# Patient Record
Sex: Female | Born: 1955 | Race: Black or African American | Hispanic: No | Marital: Single | Smoking: Former smoker
Health system: Southern US, Community
[De-identification: ages and names within clinical notes are randomized; demographics above are authoritative.]

## PROBLEM LIST (undated history)

## (undated) DIAGNOSIS — F99 Mental disorder, not otherwise specified: Secondary | ICD-10-CM

## (undated) DIAGNOSIS — J45909 Unspecified asthma, uncomplicated: Secondary | ICD-10-CM

## (undated) DIAGNOSIS — E079 Disorder of thyroid, unspecified: Secondary | ICD-10-CM

## (undated) DIAGNOSIS — E119 Type 2 diabetes mellitus without complications: Secondary | ICD-10-CM

## (undated) DIAGNOSIS — D649 Anemia, unspecified: Secondary | ICD-10-CM

## (undated) HISTORY — DX: Unspecified asthma, uncomplicated: J45.909

## (undated) HISTORY — DX: Mental disorder, not otherwise specified: F99

## (undated) HISTORY — PX: THYROIDECTOMY: SHX17

## (undated) HISTORY — PX: CHOLECYSTECTOMY: SHX55

---

## 2014-12-07 LAB — PULMONARY FUNCTION TEST

## 2015-02-28 ENCOUNTER — Emergency Department (HOSPITAL_COMMUNITY)
Admission: EM | Admit: 2015-02-28 | Discharge: 2015-02-28 | Disposition: A | Discharge status: Home / Self Care | Payer: Federal, State, Local not specified - PPO | Source: Home / Self Care

## 2015-02-28 ENCOUNTER — Emergency Department (INDEPENDENT_AMBULATORY_CARE_PROVIDER_SITE_OTHER): Payer: Federal, State, Local not specified - PPO

## 2015-02-28 ENCOUNTER — Encounter (HOSPITAL_COMMUNITY): Payer: Self-pay | Admitting: Emergency Medicine

## 2015-02-28 DIAGNOSIS — S82891A Other fracture of right lower leg, initial encounter for closed fracture: Secondary | ICD-10-CM

## 2015-02-28 HISTORY — DX: Type 2 diabetes mellitus without complications: E11.9

## 2015-02-28 HISTORY — DX: Disorder of thyroid, unspecified: E07.9

## 2015-02-28 HISTORY — DX: Anemia, unspecified: D64.9

## 2015-02-28 MED ORDER — HYDROCODONE-ACETAMINOPHEN 5-325 MG PO TABS: 1.0000 | ORAL_TABLET | ORAL | Status: DC | PRN

## 2015-02-28 MED ORDER — KETOROLAC TROMETHAMINE 60 MG/2ML IM SOLN
60.0000 mg | Freq: Once | INTRAMUSCULAR | Status: AC
  Administered 2015-02-28: 60 mg via INTRAMUSCULAR

## 2015-02-28 MED ORDER — KETOROLAC TROMETHAMINE 60 MG/2ML IM SOLN
INTRAMUSCULAR | Status: AC
  Filled 2015-02-28: qty 2

## 2015-02-28 NOTE — ED Provider Notes (Signed)
CSN: [id number]     Arrival date & time 02/28/15  1543 History   None    Chief Complaint  Patient presents with  . Ankle Injury   (Consider location/radiation/quality/duration/timing/severity/associated sxs/prior Treatment) Patient is a 59 y.o. female presenting with lower extremity injury. The history is provided by the patient.  Ankle Injury This is a new problem. The current episode started yesterday. The problem occurs constantly. The problem has been gradually worsening. The symptoms are aggravated by exertion. Nothing relieves the symptoms. She has tried acetaminophen for the symptoms.    Past Medical History  Diagnosis Date  . Diabetes mellitus without complication (HCC)   . Thyroid disease   . Anemia    Past Surgical History  Procedure Laterality Date  . Thyroidectomy    . Cholecystectomy    . Cesarean section     History reviewed. No pertinent family history. Social History  Substance Use Topics  . Smoking status: Never Smoker   . Smokeless tobacco: None  . Alcohol Use: Yes     Comment: social   OB History    No data available     Review of Systems  Constitutional: Negative.   HENT: Negative.   Eyes: Negative.   Respiratory: Negative.   Cardiovascular: Negative.   Gastrointestinal: Negative.   Endocrine: Negative.   Genitourinary: Negative.   Musculoskeletal: Positive for joint swelling and arthralgias.  Skin: Negative.   Allergic/Immunologic: Negative.   Neurological: Negative.   Hematological: Negative.   Psychiatric/Behavioral: Negative.     Allergies  Review of patient's allergies indicates no known allergies.  Home Medications   Prior to Admission medications   Medication Sig Start Date End Date Taking? Authorizing Provider  DULoxetine (CYMBALTA) 30 MG capsule Take 30 mg by mouth daily.   Yes Historical Provider, MD  levothyroxine (SYNTHROID, LEVOTHROID) 100 MCG tablet Take 100 mcg by mouth daily before breakfast.   Yes Historical Provider,  MD  omeprazole (PRILOSEC) 20 MG capsule Take 20 mg by mouth daily.   Yes Historical Provider, MD  ranitidine (ZANTAC) 150 MG capsule Take 150 mg by mouth 2 (two) times daily.   Yes Historical Provider, MD  HYDROcodone-acetaminophen (NORCO/VICODIN) 5-325 MG tablet Take 1 tablet by mouth every 4 (four) hours as needed. 02/28/15   Deatra CanterWilliam J Moraima Burd, FNP   Meds Ordered and Administered this Visit   Medications  ketorolac (TORADOL) injection 60 mg (60 mg Intramuscular Given 02/28/15 1753)    BP 130/71 mmHg  Pulse 72  Temp(Src) 97.9 F (36.6 C) (Oral)  Resp 16  SpO2 100% No data found.   Physical Exam  Constitutional: She appears well-developed and well-nourished.  HENT:  Head: Normocephalic and atraumatic.  Right Ear: External ear normal.  Left Ear: External ear normal.  Nose: Nose normal.  Mouth/Throat: Oropharynx is clear and moist.  Eyes: Conjunctivae and EOM are normal. Pupils are equal, round, and reactive to light.  Neck: Normal range of motion. Neck supple.  Cardiovascular: Normal rate, regular rhythm and normal heart sounds.   Pulmonary/Chest: Effort normal and breath sounds normal.  Abdominal: Soft. Bowel sounds are normal.  Musculoskeletal: She exhibits tenderness.  TTP dorsal area anterior ankle with mild edema. Decreased ROM and patient having difficulty with bearing weight and had to be wheeled into room with WC.    ED Course  Procedures (including critical care time)  Labs Review Labs Reviewed - No data to display  Imaging Review Dg Ankle Complete Right  02/28/2015  CLINICAL DATA:  Status  post fall down stairs today. Right foot pain. Difficulty bearing weight. Initial encounter. EXAM: RIGHT ANKLE - COMPLETE 3+ VIEW COMPARISON:  None. FINDINGS: Small avulsion fractures are seen off the dorsal neck of the talus and dorsal navicular bone. No other fracture is identified. Plantar calcaneal spur is noted. Mild midfoot degenerative change is seen. IMPRESSION: Small  avulsion fractures dorsal neck of the talus and navicular bones. Electronically Signed   By: Drusilla Kanner M.D.   On: 02/28/2015 17:37   Dg Foot Complete Right  02/28/2015  CLINICAL DATA:  Larey Seat down stairs today. Right foot and ankle pain. Unable to bear weight. Initial encounter. EXAM: RIGHT FOOT COMPLETE - 3+ VIEW COMPARISON:  None. FINDINGS: There are small avulsion fractures involving the dorsal navicular and dorsal anterior talus with overlying soft tissue swelling. There is no dislocation. Mild deformity of the distal phalanx of the great toe may be developmental or related to remote trauma. A small plantar calcaneal enthesophyte is noted. IMPRESSION: Small avulsion fractures of the dorsal talus and navicular. Electronically Signed   By: Sebastian Ache M.D.   On: 02/28/2015 17:41     Visual Acuity Review  Right Eye Distance:   Left Eye Distance:   Bilateral Distance:    Right Eye Near:   Left Eye Near:    Bilateral Near:         MDM   1. Avulsion fracture of right ankle, closed, initial encounter    Right Boot for imobilization.  Follow up with Orthopedics tomorrow. Norco 1 po q 4 hours for pain #10 Crutches.  Note for work.  Anselm Pancoast Mayreli Alden FNP    Deatra Canter, FNP 02/28/15 1816

## 2015-02-28 NOTE — ED Notes (Signed)
Pt fell down a flight of stairs this morning, twisting her right ankle and injuring the foot and ankle.  She is not exactly sure what she may have done to it.  She was hoping it would get better, but it has gotten progressively worse as the day has gone on.  She has some swelling on the lateral ankle and on the top of her foot.

## 2015-02-28 NOTE — Discharge Instructions (Signed)
°Cast or Splint Care  ° ° °Casts and splints support injured limbs and keep bones from moving while they heal. It is important to care for your cast or splint at home.  °HOME CARE INSTRUCTIONS  °Keep the cast or splint uncovered during the drying period. It can take 24 to 48 hours to dry if it is made of plaster. A fiberglass cast will dry in less than 1 hour.  °Do not rest the cast on anything harder than a pillow for the first 24 hours.  °Do not put weight on your injured limb or apply pressure to the cast until your health care provider gives you permission.  °Keep the cast or splint dry. Wet casts or splints can lose their shape and may not support the limb as well. A wet cast that has lost its shape can also create harmful pressure on your skin when it dries. Also, wet skin can become infected.  °Cover the cast or splint with a plastic bag when bathing or when out in the rain or snow. If the cast is on the trunk of the body, take sponge baths until the cast is removed.  °If your cast does become wet, dry it with a towel or a blow dryer on the cool setting only. °Keep your cast or splint clean. Soiled casts may be wiped with a moistened cloth.  °Do not place any hard or soft foreign objects under your cast or splint, such as cotton, toilet paper, lotion, or powder.  °Do not try to scratch the skin under the cast with any object. The object could get stuck inside the cast. Also, scratching could lead to an infection. If itching is a problem, use a blow dryer on a cool setting to relieve discomfort.  °Do not trim or cut your cast or remove padding from inside of it.  °Exercise all joints next to the injury that are not immobilized by the cast or splint. For example, if you have a long leg cast, exercise the hip joint and toes. If you have an arm cast or splint, exercise the shoulder, elbow, thumb, and fingers.  °Elevate your injured arm or leg on 1 or 2 pillows for the first 1 to 3 days to decrease swelling and  pain. It is best if you can comfortably elevate your cast so it is higher than your heart. °SEEK MEDICAL CARE IF:  °Your cast or splint cracks.  °Your cast or splint is too tight or too loose.  °You have unbearable itching inside the cast.  °Your cast becomes wet or develops a soft spot or area.  °You have a bad smell coming from inside your cast.  °You get an object stuck under your cast.  °Your skin around the cast becomes red or raw.  °You have new pain or worsening pain after the cast has been applied. °SEEK IMMEDIATE MEDICAL CARE IF:  °You have fluid leaking through the cast.  °You are unable to move your fingers or toes.  °You have discolored (blue or white), cool, painful, or very swollen fingers or toes beyond the cast.  °You have tingling or numbness around the injured area.  °You have severe pain or pressure under the cast.  °You have any difficulty with your breathing or have shortness of breath.  °You have chest pain. °This information is not intended to replace advice given to you by your health care provider. Make sure you discuss any questions you have with your health care provider.  °  Document Released: 04/06/2000 Document Revised: 01/28/2013 Document Reviewed: 10/16/2012  °Elsevier Interactive Patient Education ©2016 [employer]  ° °

## 2015-08-10 ENCOUNTER — Encounter (HOSPITAL_COMMUNITY): Payer: Self-pay | Admitting: Emergency Medicine

## 2015-08-10 ENCOUNTER — Ambulatory Visit (HOSPITAL_COMMUNITY)
Admission: EM | Admit: 2015-08-10 | Discharge: 2015-08-10 | Disposition: A | Discharge status: Home / Self Care | Payer: Federal, State, Local not specified - PPO | Attending: Emergency Medicine | Admitting: Emergency Medicine

## 2015-08-10 DIAGNOSIS — L03011 Cellulitis of right finger: Secondary | ICD-10-CM

## 2015-08-10 MED ORDER — IBUPROFEN 800 MG PO TABS
ORAL_TABLET | ORAL | Status: AC
  Filled 2015-08-10: qty 1

## 2015-08-10 MED ORDER — IBUPROFEN 800 MG PO TABS
800.0000 mg | ORAL_TABLET | Freq: Once | ORAL | Status: AC
  Administered 2015-08-10: 800 mg via ORAL

## 2015-08-10 MED ORDER — CEPHALEXIN 500 MG PO CAPS: 500.0000 mg | ORAL_CAPSULE | Freq: Four times a day (QID) | ORAL | Status: DC

## 2015-08-10 MED ORDER — LIDOCAINE HCL (PF) 2 % IJ SOLN
INTRAMUSCULAR | Status: AC
  Filled 2015-08-10: qty 2

## 2015-08-10 MED ORDER — HYDROCODONE-ACETAMINOPHEN 5-325 MG PO TABS: 1.0000 | ORAL_TABLET | Freq: Four times a day (QID) | ORAL | Status: DC | PRN

## 2015-08-10 NOTE — ED Provider Notes (Signed)
CSN: [id number]     Arrival date & time 08/10/15  1324 History   First MD Initiated Contact with Patient 08/10/15 1449     Chief Complaint  Patient presents with  . Finger Injury   (Consider location/radiation/quality/duration/timing/severity/associated sxs/prior Treatment) HPI  She is a 60 year old woman here for evaluation of right middle finger pain. She states she noticed some swelling around the cuticle about a week ago. In the last day or 2 she has noticed a pus pocket developed. She states it is quite painful. She did [facility], who recommended going to an urgent care to get it drained. She denies biting her nails, but states she does pick at the cuticles.  Past Medical History  Diagnosis Date  . Diabetes mellitus without complication (HCC)   . Thyroid disease   . Anemia    Past Surgical History  Procedure Laterality Date  . Thyroidectomy    . Cholecystectomy    . Cesarean section     No family history on file. Social History  Substance Use Topics  . Smoking status: Never Smoker   . Smokeless tobacco: None  . Alcohol Use: Yes     Comment: social   OB History    No data available     Review of Systems As in history of present illness Allergies  Review of patient's allergies indicates no known allergies.  Home Medications   Prior to Admission medications   Medication Sig Start Date End Date Taking? Authorizing Provider  cephALEXin (KEFLEX) 500 MG capsule Take 1 capsule (500 mg total) by mouth 4 (four) times daily. 08/10/15   Charm RingsErin J Natori Gudino, MD  DULoxetine (CYMBALTA) 30 MG capsule Take 30 mg by mouth daily.    Historical Provider, MD  HYDROcodone-acetaminophen (NORCO) 5-325 MG tablet Take 1 tablet by mouth every 6 (six) hours as needed for moderate pain. 08/10/15   Charm RingsErin J Natilee Gauer, MD  levothyroxine (SYNTHROID, LEVOTHROID) 100 MCG tablet Take 100 mcg by mouth daily before breakfast.    Historical Provider, MD  omeprazole (PRILOSEC) 20 MG capsule Take 20 mg by  mouth daily.    Historical Provider, MD  ranitidine (ZANTAC) 150 MG capsule Take 150 mg by mouth 2 (two) times daily.    Historical Provider, MD   Meds Ordered and Administered this Visit   Medications  ibuprofen (ADVIL,MOTRIN) tablet 800 mg (800 mg Oral Given 08/10/15 1543)    BP 144/77 mmHg  Pulse 89  Temp(Src) 98 F (36.7 C) (Oral)  Resp 20  SpO2 97% No data found.   Physical Exam  Constitutional: She is oriented to person, place, and time. She appears well-developed and well-nourished. No distress.  Cardiovascular: Normal rate.   Pulmonary/Chest: Effort normal.  Musculoskeletal:  Middle finger: There is swelling and erythema in the distal finger. There is fluctuance around the cuticle.  Neurological: She is alert and oriented to person, place, and time.    ED Course  .Marland Kitchen.Incision and Drainage Date/Time: 08/10/2015 3:53 PM Performed by: Charm RingsHONIG, Tyleek Smick J Authorized by: Charm RingsHONIG, Becca Bayne J Consent: Verbal consent obtained. Risks and benefits: risks, benefits and alternatives were discussed Consent given by: patient Patient understanding: patient states understanding of the procedure being performed Patient identity confirmed: verbally with patient Time out: Immediately prior to procedure a "time out" was called to verify the correct patient, procedure, equipment, support staff and site/side marked as required. Type: abscess Body area: upper extremity Location details: right long finger Anesthesia: local infiltration Local anesthetic: lidocaine  2% without epinephrine Anesthetic total: 0.5 ml Scalpel size: 11 Incision type: single straight Incision depth: dermal Complexity: simple Drainage: purulent and  serous Drainage amount: moderate Wound treatment: wound left open Patient tolerance: Patient tolerated the procedure well with no immediate complications   (including critical care time)  Labs Review Labs Reviewed - No data to display  Imaging Review No results  found.   MDM   1. Paronychia of finger of right hand    I&D performed today. Keflex for 1 week. Recommended warm soaks 3 times a day for the next several days. Prescription given for hydrocodone to use as needed for pain. Follow-up if not improving in 2 days.    Charm Rings, MD 08/10/15 [phone]

## 2015-08-10 NOTE — ED Notes (Signed)
PT has swelling and pain around right middle finger nail. Dr. Piedad ClimesHonig at bedside

## 2015-08-10 NOTE — Discharge Instructions (Signed)
You have an infection of the cuticle called paronychia. I drained the pus today. Please do warm soaks with Epsom salts or soap for 20 minutes at least 3 times a day. Take the Keflex as prescribed. Use the hydrocodone every 4-6 hours as needed for pain. You can also apply ice to help with the pain. If this is not improving in 2 days, please come back.

## 2017-06-10 ENCOUNTER — Encounter: Payer: Self-pay | Admitting: Internal Medicine

## 2017-06-10 ENCOUNTER — Ambulatory Visit (INDEPENDENT_AMBULATORY_CARE_PROVIDER_SITE_OTHER): Payer: Non-veteran care | Admitting: Internal Medicine

## 2017-06-10 ENCOUNTER — Other Ambulatory Visit (INDEPENDENT_AMBULATORY_CARE_PROVIDER_SITE_OTHER): Payer: Non-veteran care

## 2017-06-10 VITALS — BP 132/74 | HR 85 | Ht 63.0 in | Wt 232.0 lb

## 2017-06-10 DIAGNOSIS — J45991 Cough variant asthma: Secondary | ICD-10-CM | POA: Diagnosis not present

## 2017-06-10 DIAGNOSIS — R0609 Other forms of dyspnea: Secondary | ICD-10-CM | POA: Diagnosis not present

## 2017-06-10 LAB — CBC WITH DIFFERENTIAL/PLATELET
BASOS ABS: 0.1 10*3/uL (ref 0.0–0.1)
Basophils Relative: 1.1 % (ref 0.0–3.0)
EOS ABS: 0.2 10*3/uL (ref 0.0–0.7)
Eosinophils Relative: 3.9 % (ref 0.0–5.0)
HCT: 34.2 % — ABNORMAL LOW (ref 36.0–46.0)
Hemoglobin: 11.5 g/dL — ABNORMAL LOW (ref 12.0–15.0)
LYMPHS ABS: 1.7 10*3/uL (ref 0.7–4.0)
Lymphocytes Relative: 27.8 % (ref 12.0–46.0)
MCHC: 33.5 g/dL (ref 30.0–36.0)
MCV: 77.9 fl — ABNORMAL LOW (ref 78.0–100.0)
MONO ABS: 0.4 10*3/uL (ref 0.1–1.0)
Monocytes Relative: 7.2 % (ref 3.0–12.0)
NEUTROS PCT: 60 % (ref 43.0–77.0)
Neutro Abs: 3.7 10*3/uL (ref 1.4–7.7)
Platelets: 259 10*3/uL (ref 150.0–400.0)
RBC: 4.38 Mil/uL (ref 3.87–5.11)
RDW: 14.2 % (ref 11.5–15.5)
WBC: 6.1 10*3/uL (ref 4.0–10.5)

## 2017-06-10 MED ORDER — OMEPRAZOLE 40 MG PO CPDR: DELAYED_RELEASE_CAPSULE | ORAL | 2 refills | Status: DC

## 2017-06-10 NOTE — Progress Notes (Addendum)
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   06/10/2017 1st [facility] office visit/ Wert   Chief Complaint  Patient presents with  . Pulmonary Consult    [facility]. She states she was dxed with Asthma a few years ago.  She c/o SOB that comes and goes without any specific triggers. She uses proair 3 x per wk on average.   variable sob / sometimes at rest for one breath, then resolves, rarely lasts more than a few secs  s assoc cough or wheeze/ assoc overt hb Walking limited pain in knees  cpap at hs x one year thru [place]  No obvious day to day or daytime variability or assoc excess/ purulent sputum or mucus plugs or hemoptysis or cp or chest tightness, subjective wheeze. No unusual exposure hx or h/o childhood pna/ asthma or knowledge of premature birth.  Sleeping ok flat without nocturnal  or early am exacerbation  of respiratory  c/o's or need for noct saba. Also denies any obvious fluctuation of symptoms with weather or environmental changes or other aggravating or alleviating factors except as outlined above   Current Allergies, Complete Past Medical History, Past Surgical History, Family History, and Social History were reviewed in [employer] record.  ROS  The following are not active complaints unless bolded Hoarseness, sore throat, dysphagia, dental problems, itching, sneezing,  nasal congestion or discharge of excess mucus or purulent secretions,  ear ache,   fever, chills, sweats, unintended wt loss or wt gain, classically pleuritic or exertional cp,  orthopnea pnd or leg swelling, presyncope, palpitations, abdominal pain, anorexia, nausea, vomiting, diarrhea  or change in bowel habits or change in bladder habits, change in stools or change in urine, dysuria, hematuria,  rash, arthralgias, visual complaints, headache, numbness, weakness or ataxia or problems with walking or coordination,  change in mood/affect or memory.        Current Meds  Medication Sig  . albuterol (PROAIR HFA) 108 (90 Base) MCG/ACT inhaler Inhale 2 puffs into the lungs every 6 (six) hours as needed for wheezing or shortness of breath.  . DULoxetine (CYMBALTA) 30 MG capsule Take 30 mg by mouth daily.  Marland Kitchen levothyroxine (SYNTHROID, LEVOTHROID) 100 MCG tablet Take 100 mcg by mouth daily before breakfast.  . METFORMIN HCL PO Take 1 tablet by mouth 2 (two) times daily. Unsure of strength  . ranitidine (ZANTAC) 150 MG capsule Take 150 mg by mouth every evening.   . [DISCONTINUED] omeprazole (PRILOSEC) 20 MG capsule Take 20 mg by mouth 2 (two) times daily.          Review of Systems     Objective:   Physical Exam    amb obese bf nad with extremely harsh cough   Wt Readings from Last 3 Encounters:  06/10/17 232 lb (105.2 kg)     Vital signs reviewed - Note on  arrival 02 sats  97% on RA      HEENT: nl dentition and oropharynx. Nl external ear canals without cough reflex -  moderate bilateral non-specific turbinate edema     NECK :  without JVD/Nodes/TM/ nl carotid upstrokes bilaterally   LUNGS: no acc muscle use,  Nl contour chest which is clear to A and P bilaterally with  Cough early  on insp    CV:  RRR  no s3 or murmur or increase in P2, and no edema   ABD:  soft and nontender with nl inspiratory excursion in the supine position. No bruits or organomegaly appreciated, bowel sounds nl  MS:  Nl gait/ ext warm without deformities, calf tenderness,  cyanosis or clubbing No obvious joint restrictions   SKIN: warm and dry without lesions    NEURO:  alert, approp, nl sensorium with  no motor or cerebellar deficits apparent.      Labs ordered 06/10/2017    Allergy eval    Cxr: none on file    Assessment:

## 2017-06-10 NOTE — Patient Instructions (Addendum)
Change prilosec to 20 mg x 2  = 40 mg Take 30- 60 min before your first and last meals of the day and contine zantac 150 mg at bedtime  Instead of your present allergy pill try For drainage / throat tickle try take CHLORPHENIRAMINE  4 mg - take one every 4 hours as needed - available over the counter- may cause drowsiness so start with just a bedtime dose or two and see how you tolerate it before trying in daytime      GERD (REFLUX)  is an extremely common cause of respiratory symptoms just like yours , many times with no obvious heartburn at all.    It can be treated with medication, but also with lifestyle changes including elevation of the head of your bed (ideally with 6 inch  bed blocks),  Smoking cessation, avoidance of late meals, excessive alcohol, and avoid fatty foods, chocolate, peppermint, colas, red wine, and acidic juices such as orange juice.  NO MINT OR MENTHOL PRODUCTS SO NO COUGH DROPS   USE SUGARLESS CANDY INSTEAD (Jolley ranchers or Stover's or Life Savers) or even ice chips will also do - the key is to swallow to prevent all throat clearing. NO OIL BASED VITAMINS - use powdered substitutes.  Please remember to go to the lab department downstairs in the basement  for your tests - we will call you with the results when they are available.      Please schedule a follow up office visit in 6 weeks, call sooner if needed  - needs cxr on return

## 2017-06-11 ENCOUNTER — Encounter: Payer: Self-pay | Admitting: Internal Medicine

## 2017-06-11 DIAGNOSIS — R0609 Other forms of dyspnea: Secondary | ICD-10-CM

## 2017-06-11 LAB — RESPIRATORY ALLERGY PROFILE REGION II ~~LOC~~
Allergen, A. alternata, m6: <0.1 kU/L
Allergen, Cedar tree, t12: <0.1 kU/L
Allergen, Comm Silver Birch, t9: <0.1 kU/L
Allergen, Cottonwood, t14: <0.1 kU/L
Allergen, D pternoyssinus,d7: <0.1 kU/L
Allergen, Mouse Urine Protein, e78: <0.1 kU/L
Allergen, Mulberry, t76: <0.1 kU/L
Allergen, Oak,t7: <0.1 kU/L
Allergen, P. notatum, m1: <0.1 kU/L
Aspergillus fumigatus, m3: <0.1 kU/L
BOX ELDER: 0.25 kU/L — AB
CLASS: 0
CLASS: 0
CLASS: 0
CLASS: 0
CLASS: 0
CLASS: 0
CLASS: 0
CLASS: 0
CLASS: 0
CLASS: 0
CLASS: 0
Cat Dander: <0.1 kU/L
Class: 0
Class: 0
Class: 0
Class: 0
Class: 0
Class: 0
Class: 0
Class: 0
Class: 0
Class: 0
Class: 0
Class: 0
Class: 0
D. farinae: <0.1 kU/L
Dog Dander: <0.1 kU/L
Elm IgE: <0.1 kU/L
IgE (Immunoglobulin E), Serum: 26 kU/L (ref <=114)
Johnson Grass: <0.1 kU/L
Rough Pigweed  IgE: <0.1 kU/L
Timothy Grass: <0.1 kU/L

## 2017-06-11 LAB — INTERPRETATION:

## 2017-06-11 NOTE — Assessment & Plan Note (Addendum)
06/10/2017  Walked RA x 3 laps @ 185 ft each stopped due to  End of study, slow pace, no desats  - spirometry wnl 06/10/2017   Sob at rest not reproduced with ex =  Symptoms  disproportionate to objective findings and not clear this is actually much of a  lung problem but pt does appear to have difficult to sort out respiratory symptoms of unknown origin for which  DDX  = almost all start with A and  include Adherence, Ace Inhibitors, Acid Reflux, Active Sinus Disease, Alpha 1 Antitripsin deficiency, Anxiety masquerading as Airways dz,  ABPA,  Allergy(esp in young), Aspiration (esp in elderly), Adverse effects of meds,  Active smokers, A bunch of PE's (a small clot burden can't cause this syndrome unless there is already severe underlying pulm or vascular dz with poor reserve). Anemia or thyroid dz plus two Bs  = Bronchiectasis and Beta blocker use..and one C= CHF    Adherence is always the initial "prime suspect" and is a multilayered concern that requires a "trust but verify" approach in every patient - starting with knowing how to use medications, especially inhalers, correctly, keeping up with refills and understanding the fundamental difference between maintenance and prns vs those medications only taken for a very short course and then stopped and not refilled.  - rec return with all meds in hand using a trust but verify approach to confirm accurate Medication  Reconciliation The principal here is that until we are certain that the  patients are doing what we've asked, it makes no sense to ask them to do more.    ? Acid (or non-acid) GERD > always difficult to exclude as up to 75% of pts in some series report no assoc GI/ Heartburn symptoms> rec max (24h)  acid suppression and diet restrictions/ reviewed and instructions given in writing.   ? Anxiety/obesity/ deconditioning  > usually at the bottom of this list of usual suspects but should be much higher on this pt's based on H and P and note already  on psychotropics .   ? Anemia / thyroid dz > note she is boderline anemia/ microcytic and on thryoid med  - [facility]  ? Allergies/ asthma > send profile, just use hfa for now prn as doubt this is asthma (see separate a/p)    see avs for instructions unique to this ov

## 2017-06-11 NOTE — Progress Notes (Signed)
Spoke with pt and notified of results per Dr. Wert. Pt verbalized understanding and denied any questions. 

## 2017-06-11 NOTE — Assessment & Plan Note (Signed)
Body mass index is 41.1 kg/m.  -   No results found for: TSH   Contributing to gerd risk/ doe/reviewed the need and the process to achieve and maintain neg calorie balance > defer f/u primary care including intermittently monitoring thyroid status

## 2017-06-11 NOTE — Assessment & Plan Note (Addendum)
Spirometry  8//16/16  FEV1 2.43 (118%0 and ratio 85 with nl insp/exp f/v loop  Spirometry 06/10/2017  FEV1 2.41 (119%)  Ratio 86   Allergy profile 06/10/2017 >  Eos 0.2 /  IgE pending  - max rx for gerd and 1st gen H1 blockers per guidelines  06/10/2017 >>>  Doubt asthma here and instead favor: Upper airway cough syndrome (previously labeled PNDS),  is so named because it's frequently impossible to sort out how much is  CR/sinusitis with freq throat clearing (which can be related to primary GERD)   vs  causing  secondary (" extra esophageal")  GERD from wide swings in gastric pressure that occur with throat clearing, often  promoting self use of mint and menthol lozenges that reduce the lower esophageal sphincter tone and exacerbate the problem further in a cyclical fashion.   These are the same pts (now being labeled as having "irritable larynx syndrome" by some cough centers) who not infrequently have a history of having failed to tolerate ace inhibitors,  dry powder inhalers or biphosphonates or report having atypical/extraesophageal reflux symptoms that don't respond to standard doses of PPI  and are easily confused as having aecopd or asthma flares by even experienced allergists/ pulmonologists (myself included).   rec trial of rx for gerd and 1st gen H1 per guidelines then return to regroup in 6 weeks    Total time devoted to counseling  > 50 % of initial 60 min office visit:  review case with pt/ discussion of options/alternatives/ personally creating written customized instructions  in presence of pt  then going over those specific  Instructions directly with the pt including how to use all of the meds but in particular covering each new medication in detail and the difference between the maintenance= "automatic" meds and the prns using an action plan format for the latter (If this problem/symptom => do that organization reading Left to right).  Please see AVS from this visit for a full list of  these instructions which I personally wrote for this pt and  are unique to this visit.

## 2017-07-11 ENCOUNTER — Telehealth: Payer: Self-pay | Admitting: Internal Medicine

## 2017-07-11 NOTE — Telephone Encounter (Signed)
Ov note faxed to the number that was provided

## 2017-07-22 ENCOUNTER — Encounter: Payer: Self-pay | Admitting: Internal Medicine

## 2017-07-22 ENCOUNTER — Ambulatory Visit (INDEPENDENT_AMBULATORY_CARE_PROVIDER_SITE_OTHER): Payer: Non-veteran care | Admitting: Internal Medicine

## 2017-07-22 VITALS — BP 134/78 | HR 97 | Ht 63.0 in | Wt 232.6 lb

## 2017-07-22 DIAGNOSIS — R0609 Other forms of dyspnea: Secondary | ICD-10-CM | POA: Diagnosis not present

## 2017-07-22 DIAGNOSIS — J45991 Cough variant asthma: Secondary | ICD-10-CM

## 2017-07-22 NOTE — Patient Instructions (Addendum)
Continue omeprazole 40 mg Take 30- 60 min before your first and last meals of the day   Take zantac = ranitidine at 150 mg at bedtime    If still having bad heartburn then use gaviscon liquid if really can't avoid the foods / drinks that cause it   For drainage / throat tickle try take CHLORPHENIRAMINE  4 mg - take one every 4 hours as needed - available over the counter- may cause drowsiness so start with just a bedtime dose or two and see how you tolerate it before trying in daytime     GERD (REFLUX)  is an extremely common cause of respiratory symptoms just like yours , many times with no obvious heartburn at all.    It can be treated with medication, but also with lifestyle changes including elevation of the head of your bed (ideally with 6 inch  bed blocks),  Smoking cessation, avoidance of late meals, excessive alcohol, and avoid fatty foods, chocolate, peppermint, colas, red wine, and acidic juices such as orange juice.  NO MINT OR MENTHOL PRODUCTS SO NO COUGH DROPS   USE SUGARLESS CANDY INSTEAD (Jolley ranchers or Stover's or Life Savers) or even ice chips will also do - the key is to swallow to prevent all throat clearing. NO OIL BASED VITAMINS - use powdered substitutes.   Please see patient coordinator before you leave today  to schedule methacholine challenge testing  Bring chest x-ray report from [facility]    Please schedule a follow up office visit in 4 weeks, sooner if needed

## 2017-07-22 NOTE — Progress Notes (Signed)
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     History of Present Illness  06/10/2017 1st [facility] office visit/ Samanatha Brammer   Chief Complaint  Patient presents with  . Pulmonary Consult    [facility]. She states she was dxed with Asthma a few years ago.  She c/o SOB that comes and goes without any specific triggers. She uses proair 3 x per wk on average.   variable sob / sometimes at rest for one breath, then resolves, rarely lasts more than a few secs  s assoc cough or wheeze/ assoc overt hb Walking limited pain in knees  [facility]> sleeps fine s aware of problem Change prilosec to 20 mg x 2  = 40 mg Take 30- 60 min before your first and last meals of the day and contine zantac 150 mg at bedtime  Instead of your present allergy pill try For drainage / throat tickle try take CHLORPHENIRAMINE  4 mg - take one every 4 hours as needed - available over the counter- may cause drowsiness so start with just a bedtime dose or two and see how you tolerate it before trying in daytime      GERD diet       07/22/2017  f/u ov/[place] re: uacs using mint lifesavers Chief Complaint  Patient presents with  . Follow-up    Breathing is unchanged. She states she has not used her rescue inhaler since the last visit.   Dyspnea:  Bike once a week x 5 min then stops s symptoms  Cough: better but still in am one a tsp or two white mucus   And  persistent sensation of pnds daytime but no mucus produced other than first thing ing am but doesn't disturb sleep Sleep: on cpap  SABA use:  None since last ov Only using h1 hs none daytime   No obvious day to day or daytime variability or assoc excess/ purulent sputum or mucus plugs or hemoptysis or cp or chest tightness, subjective wheeze or overt sinus or hb symptoms. No unusual exposure hx or h/o childhood pna/ asthma or knowledge of premature birth.  Sleeping ok on cpap without nocturnal  or early am exacerbation  of respiratory  c/o's or need for noct saba. Also denies any obvious fluctuation of symptoms with weather or environmental changes or other aggravating or alleviating factors except as outlined above   Current Allergies, Complete Past Medical History, Past Surgical History, Family History, and Social History were reviewed in [employer] record.  ROS  The following are not active complaints unless bolded Hoarseness, sore throat, dysphagia, dental problems, itching, sneezing,  nasal congestion or discharge of excess mucus or purulent secretions, ear ache,   fever, chills, sweats, unintended wt loss or wt gain, classically pleuritic or exertional cp,  orthopnea pnd or leg swelling, presyncope, palpitations, abdominal pain, anorexia, nausea, vomiting, diarrhea  or change in bowel habits or change in bladder habits, change in stools or change in urine, dysuria, hematuria,  rash, arthralgias, visual complaints, headache, numbness, weakness or ataxia or problems with walking or coordination,  change in mood/affect or memory.        Current Meds  Medication Sig  . albuterol (PROAIR HFA) 108 (90 Base) MCG/ACT inhaler Inhale 2 puffs into the lungs every 6 (six) hours as needed for wheezing or shortness of breath.  . ALPRAZolam (XANAX) 1 MG tablet Take 1 mg by mouth 2 (two) times daily as needed for anxiety.  Marland Kitchen. buPROPion (WELLBUTRIN XL) 300 MG 24 hr tablet Take 300 mg  by mouth daily.  . chlorpheniramine (CHLOR-TRIMETON) 4 MG tablet Take 4 mg by mouth 2 (two) times daily as needed for allergies.  . cyclobenzaprine (FLEXERIL) 10 MG tablet Take 5 mg by mouth 2 (two) times daily.  Marland Kitchen. gabapentin (NEURONTIN) 300 MG capsule Take 300 mg by mouth 3 (three) times daily.  Marland Kitchen. levothyroxine (SYNTHROID, LEVOTHROID) 125 MCG tablet Take 125 mcg by mouth daily before breakfast.  . methocarbamol (ROBAXIN) 750 MG tablet Take 750 mg by mouth 2 (two) times daily.  Marland Kitchen. omeprazole (PRILOSEC) 40 MG capsule Take 30- 60 min before your first and last meals of the day  . prazosin (MINIPRESS) 2 MG capsule Take 2 mg by mouth at bedtime.  . ranitidine (ZANTAC) 150 MG capsule Take 150 mg by mouth every evening.   . SUMAtriptan (IMITREX) 100 MG tablet Take 100 mg by mouth every 2 (two) hours as needed for migraine. May repeat in 2 hours if headache persists or recurs.  . Vilazodone HCl (VIIBRYD) 40 MG TABS Take 40 mg by mouth daily.          Objective:   Physical Exam  amb bf nad  Wt Readings from Last 3 Encounters:  07/22/17 232 lb 9.6 oz (105.5 kg)  06/10/17 232 lb (105.2 kg)     Vital signs reviewed - Note on arrival 02 sats  99% on RA        HEENT: nl dentition,   and oropharynx. Nl external ear canals without cough reflex - mild bilateral non-specific turbinate edema     NECK :  without JVD/Nodes/TM/ nl carotid upstrokes bilaterally   LUNGS: no acc muscle use,  Nl contour chest which is clear to A and P bilaterally without cough on insp or exp maneuvers   CV:  RRR  no s3 or murmur or increase in P2, and no edema   ABD:  Obese/ soft and nontender with nl inspiratory excursion in the supine position. No bruits or organomegaly appreciated, bowel sounds nl  MS:  Nl gait/ ext warm without deformities, calf tenderness, cyanosis or clubbing No obvious joint restrictions   SKIN: warm and dry without lesions    NEURO:  alert, approp, nl sensorium with  no motor or  cerebellar deficits apparent.           Assessment:

## 2017-07-23 ENCOUNTER — Encounter: Payer: Self-pay | Admitting: Internal Medicine

## 2017-07-23 NOTE — Assessment & Plan Note (Signed)
Spirometry  8//16/16  FEV1 2.43 (118%0 and ratio 85 with nl insp/exp f/v loop  Spirometry 06/10/2017  FEV1 2.41 (119%)  Ratio 86   Allergy profile 06/10/2017 >  Eos 0.2 /  IgE  26 RAST pos box elders only  - max rx for gerd and 1st gen H1 blockers per guidelines  06/10/2017 > improved 07/22/2017 but still sense of pnds not using h1 but at hs/ still using lots of mints - MCT scheduled   Improved but not resolved albeit not adherent with recs for h1 q 4h prn or diet restrictions on mints  Needs Methacholine challenge to complete the w/u   I had an extended discussion with the patient reviewing all relevant studies completed to date and  lasting 15 to 20 minutes of a 25 minute visit    Each maintenance medication was reviewed in detail including most importantly the difference between maintenance and prns and under what circumstances the prns are to be triggered using an action plan format that is not reflected in the computer generated alphabetically organized AVS.    Please see AVS for specific instructions unique to this visit that I personally wrote and verbalized to the the pt in detail and then reviewed with pt  by my nurse highlighting any  changes in therapy recommended at today's visit to their plan of care.

## 2017-07-23 NOTE — Assessment & Plan Note (Signed)
06/10/2017  Walked RA x 3 laps @ 185 ft each stopped due to  End of study, slow pace, no desats  - spirometry wnl 06/10/2017   Improving but not following recs for exercise > needs to build up to 30 min daily if possible and if not clear after several weeks of doing this and following her other instructions then cpst next step   Discussed in detail all the  indications, usual  risks and alternatives  relative to the benefits with patient who agrees to proceed with w/u as outlined.

## 2017-07-23 NOTE — Assessment & Plan Note (Signed)
Body mass index is 41.2 kg/m.  -  trending no change  No results found for: TSH   Contributing to gerd risk/ doe/reviewed the need and the process to achieve and maintain neg calorie balance > defer f/u primary care including intermittently monitoring thyroid status

## 2017-08-05 ENCOUNTER — Ambulatory Visit (HOSPITAL_COMMUNITY)
Admission: RE | Admit: 2017-08-05 | Discharge: 2017-08-05 | Disposition: A | Discharge status: Home / Self Care | Payer: Non-veteran care | Source: Ambulatory Visit | Attending: Internal Medicine | Admitting: Internal Medicine

## 2017-08-05 DIAGNOSIS — R0609 Other forms of dyspnea: Secondary | ICD-10-CM | POA: Diagnosis present

## 2017-08-05 LAB — PULMONARY FUNCTION TEST
FEF 25-75 POST: 2.88 L/s
FEF 25-75 PRE: 3.42 L/s
FEF2575-%Change-Post: -15 %
FEF2575-%PRED-POST: 143 %
FEF2575-%Pred-Pre: 171 %
FEV1-%Change-Post: -4 %
FEV1-%PRED-POST: 120 %
FEV1-%PRED-PRE: 125 %
FEV1-POST: 2.41 L
FEV1-Pre: 2.51 L
FEV1FVC-%Change-Post: 0 %
FEV1FVC-%PRED-PRE: 109 %
FEV6-%Change-Post: -3 %
FEV6-%PRED-POST: 114 %
FEV6-%PRED-PRE: 118 %
FEV6-POST: 2.82 L
FEV6-Pre: 2.91 L
FEV6FVC-%PRED-POST: 104 %
FEV6FVC-%Pred-Pre: 104 %
FVC-%CHANGE-POST: -3 %
FVC-%PRED-POST: 110 %
FVC-%PRED-PRE: 114 %
FVC-Post: 2.82 L
FVC-Pre: 2.91 L
POST FEV6/FVC RATIO: 100 %
PRE FEV1/FVC RATIO: 86 %
Post FEV1/FVC ratio: 86 %
Pre FEV6/FVC Ratio: 100 %

## 2017-08-05 MED ORDER — METHACHOLINE 16 MG/ML NEB SOLN
2.0000 mL | Freq: Once | RESPIRATORY_TRACT | Status: AC
  Administered 2017-08-05: 32 mg via RESPIRATORY_TRACT

## 2017-08-05 MED ORDER — SODIUM CHLORIDE 0.9 % IN NEBU
3.0000 mL | INHALATION_SOLUTION | Freq: Once | RESPIRATORY_TRACT | Status: AC
  Administered 2017-08-05: 3 mL via RESPIRATORY_TRACT

## 2017-08-05 MED ORDER — METHACHOLINE 0.25 MG/ML NEB SOLN
2.0000 mL | Freq: Once | RESPIRATORY_TRACT | Status: AC
  Administered 2017-08-05: 0.5 mg via RESPIRATORY_TRACT

## 2017-08-05 MED ORDER — ALBUTEROL SULFATE (2.5 MG/3ML) 0.083% IN NEBU
2.5000 mg | INHALATION_SOLUTION | Freq: Once | RESPIRATORY_TRACT | Status: AC
  Administered 2017-08-05: 2.5 mg via RESPIRATORY_TRACT

## 2017-08-05 MED ORDER — METHACHOLINE 1 MG/ML NEB SOLN
2.0000 mL | Freq: Once | RESPIRATORY_TRACT | Status: AC
  Administered 2017-08-05: 2 mg via RESPIRATORY_TRACT

## 2017-08-05 MED ORDER — METHACHOLINE 0.0625 MG/ML NEB SOLN
2.0000 mL | Freq: Once | RESPIRATORY_TRACT | Status: AC
  Administered 2017-08-05: 0.125 mg via RESPIRATORY_TRACT

## 2017-08-05 MED ORDER — METHACHOLINE 4 MG/ML NEB SOLN
2.0000 mL | Freq: Once | RESPIRATORY_TRACT | Status: AC
  Administered 2017-08-05: 8 mg via RESPIRATORY_TRACT

## 2017-08-06 NOTE — Progress Notes (Signed)
LMTCB

## 2017-08-07 NOTE — Progress Notes (Signed)
LMTCB

## 2017-08-14 ENCOUNTER — Encounter: Payer: Self-pay | Admitting: *Deleted

## 2017-08-14 NOTE — Progress Notes (Signed)
Mailing letter to the pt asking her to call for the results

## 2017-08-19 ENCOUNTER — Ambulatory Visit: Payer: No Typology Code available for payment source | Admitting: Internal Medicine

## 2017-08-22 ENCOUNTER — Ambulatory Visit (INDEPENDENT_AMBULATORY_CARE_PROVIDER_SITE_OTHER): Payer: Non-veteran care | Admitting: Internal Medicine

## 2017-08-22 ENCOUNTER — Encounter: Payer: Self-pay | Admitting: *Deleted

## 2017-08-22 ENCOUNTER — Encounter: Payer: Self-pay | Admitting: Internal Medicine

## 2017-08-22 VITALS — BP 120/82 | HR 64 | Ht 63.75 in | Wt 227.4 lb

## 2017-08-22 DIAGNOSIS — J45991 Cough variant asthma: Secondary | ICD-10-CM

## 2017-08-22 DIAGNOSIS — R05 Cough: Secondary | ICD-10-CM

## 2017-08-22 DIAGNOSIS — R059 Cough, unspecified: Secondary | ICD-10-CM

## 2017-08-22 MED ORDER — ACETAMINOPHEN-CODEINE #3 300-30 MG PO TABS: 1.0000 | ORAL_TABLET | ORAL | 0 refills | Status: AC | PRN

## 2017-08-22 MED ORDER — PREDNISONE 10 MG PO TABS: ORAL_TABLET | ORAL | 0 refills | Status: DC

## 2017-08-22 NOTE — Progress Notes (Signed)
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     History of Present Illness  06/10/2017 1st [facility] office visit/ Natalie Burgess   Chief Complaint  Patient presents with  . Pulmonary Consult    [facility]. She states she was dxed with Asthma a few years ago.  She c/o SOB that comes and goes without any specific triggers. She uses proair 3 x per wk on average.   variable sob / sometimes at rest for one breath, then resolves, rarely lasts more than a few secs  s assoc cough or wheeze/ assoc overt hb Walking limited pain in knees  [facility]> sleeps fine s aware of problem Change prilosec to 20 mg x 2  = 40 mg Take 30- 60 min before your first and last meals of the day and contine zantac 150 mg at bedtime  Instead of your present allergy pill try For drainage / throat tickle try take CHLORPHENIRAMINE  4 mg - take one every 4 hours as needed - available over the counter- may cause drowsiness so start with just a bedtime dose or two and see how you tolerate it before trying in daytime      GERD diet     07/22/2017  f/u ov/Natalie Burgess re: uacs using mint lifesavers Chief Complaint  Patient presents with  . Follow-up    Breathing is unchanged. She states she has not used her rescue inhaler since the last visit.   Dyspnea:  Bike once a week x 5 min then stops s symptoms  Cough: better but still in am one a tsp or two white mucus   And persistent  sensation of pnds daytime but no mucus produced other than first thing ing am but doesn't disturb sleep Sleep: on cpap  SABA use:  None since last ov Only using h1 hs none daytime rec Continue omeprazole 40 mg Take 30- 60 min before your first and last meals of the day  Take zantac = ranitidine at 150 mg at bedtime  If still having bad heartburn then use gaviscon liquid if really can't avoid the foods / drinks that cause it  For drainage / throat tickle try take CHLORPHENIRAMINE  4 mg - take one every 4 hours as needed - available over the counter-   GERD  Diet  schedule methacholine challenge testing > neg  Bring chest x-ray report from [place]    08/22/2017  f/u ov/Natalie Burgess re:  uacs Chief Complaint  Patient presents with  . Follow-up    Discuss MCT results. She states increased cough for the past wk- went to UC while at [facility] and was given cough syrup, tessalon and amox. She is still on abx and the cough has only improved slightly. She is producing some white sputum.  She has tried her albuterol  inhaler but it has not helped so she stopped using it.   Dyspnea:  20 min on bike s stopping  Cough: was the same/ not provoked by mct / did not take the chlor more than once a day Sleep: fine on cpap SABA use: not helping/ not using   Acutely ill one week prior to OV  Cough much worse / hoarse > strep neg/ rx amox / codeine > tessalon  Produces sputum all day long mucoid but no longer excessive or purulent   Gen chest anterior aching p coughing fits   No obvious patterns in  day to day or daytime variability or assoc excess/ purulent sputum or mucus plugs or hemoptysis   or chest tightness, subjective wheeze or overt sinus or hb symptoms. No unusual exposure hx or h/o childhood pna/ asthma or knowledge of premature birth.  Sleeping  On cpap fine  without nocturnal  or early am exacerbation  of respiratory  c/o's or need for noct saba. Also denies any obvious fluctuation of symptoms with  weather or environmental changes or other aggravating or alleviating factors except as outlined above   Current Allergies, Complete Past Medical History, Past Surgical History, Family History, and Social History were reviewed in [employer] record.  ROS  The following are not active complaints unless bolded Hoarseness, sore throat, dysphagia, dental problems, itching, sneezing,  nasal congestion or discharge of excess mucus or purulent secretions, ear ache,   fever, chills, sweats, unintended wt loss or wt gain, classically pleuritic or exertional cp,  orthopnea pnd or arm/hand swelling  or leg swelling, presyncope, palpitations, abdominal pain, anorexia, nausea, vomiting, diarrhea  or change in bowel habits or change in bladder habits, change in stools or change in urine, dysuria, hematuria,  rash, arthralgias, visual complaints, headache, numbness, weakness or ataxia or problems with walking or coordination,  change in mood or  memory.        Current Meds  Medication Sig  . ALPRAZolam (XANAX) 1 MG tablet Take 1 mg by mouth 2 (two) times daily as needed for anxiety.  Marland Kitchen amoxicillin (AMOXIL) 500 MG capsule Take 1 capsule by mouth 3 (three) times daily.  Marland Kitchen buPROPion (WELLBUTRIN XL) 300 MG 24 hr tablet Take 300 mg by mouth daily.  . chlorpheniramine (CHLOR-TRIMETON) 4 MG tablet Take 4 mg by mouth 2 (two) times daily as needed for allergies.  . cyclobenzaprine (FLEXERIL) 10 MG tablet Take 5 mg by mouth 2 (two) times daily.  Marland Kitchen gabapentin (NEURONTIN) 300 MG capsule Take 300 mg by mouth 3 (three) times daily.  Marland Kitchen levothyroxine (SYNTHROID, LEVOTHROID) 125 MCG tablet Take 125 mcg by mouth daily before breakfast.  . methocarbamol (ROBAXIN) 750 MG tablet Take 750 mg by mouth 2 (two) times daily.  Marland Kitchen omeprazole (PRILOSEC) 40 MG capsule Take 30- 60 min before your first and last meals of the day  . prazosin (MINIPRESS) 2 MG capsule Take 2 mg by mouth at bedtime.  . ranitidine (ZANTAC) 150  MG capsule Take 150 mg by mouth every evening.   . SUMAtriptan (IMITREX) 100 MG tablet Take 100 mg by mouth every 2 (two) hours as needed for migraine. May repeat in 2 hours if headache persists or recurs.  . Vilazodone HCl (VIIBRYD) 40 MG TABS Take 40 mg by mouth daily.  . [DISCONTINUED] albuterol (PROAIR HFA) 108 (90 Base) MCG/ACT inhaler Inhale 2 puffs into the lungs every 6 (six) hours as needed for wheezing or shortness of breath.  . [  DISCONTINUED] benzonatate (TESSALON) 100 MG capsule Take 1 capsule by mouth 3 (three) times daily.  . [DISCONTINUED] guaiFENesin-codeine 100-10 MG/5ML syrup Take 5 mLs by mouth 2 (two) times daily as needed.                       Objective:   Physical Exam  amb bf harsh upper airway coughing fits   08/22/2017         227  07/22/17 232 lb 9.6 oz (105.5 kg)  06/10/17 232 lb (105.2 kg)    Vital signs reviewed - Note on arrival 02 sats  97% on RA   HEENT: nl dentition, turbinates bilaterally, and oropharynx. Nl external ear canals without cough reflex   NECK :  without JVD/Nodes/TM/ nl carotid upstrokes bilaterally   LUNGS: no acc muscle use,  Nl contour chest which is clear to A and P bilaterally without cough on insp or exp maneuvers   CV:  RRR  no s3 or murmur or increase in P2, and no edema   ABD:  soft and nontender with nl inspiratory excursion in the supine position. No bruits or organomegaly appreciated, bowel sounds nl  MS:  Nl gait/ ext warm without deformities, calf tenderness, cyanosis or clubbing No obvious joint restrictions   SKIN: warm and dry without lesions    NEURO:  alert, approp, nl sensorium with  no motor or cerebellar deficits apparent.      Labs ordered 08/22/2017  Allergy profile          Assessment:

## 2017-08-22 NOTE — Patient Instructions (Addendum)
Take mucinex dm 1200 mg  every 12 hours and supplement if needed with  Tylenol 3#  up to 1- 2 every 4 hours to suppress the urge to cough. Swallowing water and/or using ice chips/non mint and menthol containing candies (such as lifesavers or sugarless jolly ranchers) are also effective.  You should rest your voice and avoid activities that you know make you cough.  Once you have eliminated the cough for 3 straight days try reducing the tylenol #3  first,  then the delsym as tolerated.     Prednisone 10 mg take  4 each am x 2 days,   2 each am x 2 days,  1 each am x 2 days and stop    For drainage / throat tickle try take CHLORPHENIRAMINE  4 mg - take one-two every 4 hours as needed - available over the counter- may cause drowsiness so start with just a bedtime dose or two and see how you tolerate it before trying in daytime     Please see patient coordinator before you leave today  to schedule sinus CT    Please schedule a follow up office visit in 4 weeks, sooner if needed  with all medications /inhalers/ solutions in hand so we can verify exactly what you are taking. This includes all medications from all doctors and over the counters - separate into two bag:  Automatic vs as needed

## 2017-08-25 ENCOUNTER — Encounter: Payer: Self-pay | Admitting: Internal Medicine

## 2017-08-25 NOTE — Assessment & Plan Note (Addendum)
Spirometry  8//16/16  FEV1 2.43 (118%0 and ratio 85 with nl insp/exp f/v loop  Spirometry 06/10/2017  FEV1 2.41 (119%)  Ratio 86   Allergy profile 06/10/2017 >  Eos 0.2 /  IgE  26 RAST pos box elders only  - max rx for gerd and 1st gen H1 blockers per guidelines  06/10/2017 > improved 07/22/2017 but still sense of pnds not using h1 but at hs/ still using lots of mints   - MCT 08/05/17 > neg   - Allergy profile 08/22/2017 >  Eos 0.2 /  IgE  26 RAST pos only for box elders - Sinus CT ordered   Since asthma excluded next step is be sure she doesn't have sinus dz but mostly likely this is Upper airway cough syndrome (previously labeled PNDS),  is so named because it's frequently impossible to sort out how much is  CR/sinusitis with freq throat clearing (which can be related to primary GERD)   vs  causing  secondary (" extra esophageal")  GERD from wide swings in gastric pressure that occur with throat clearing, often  promoting self use of mint and menthol lozenges that reduce the lower esophageal sphincter tone and exacerbate the problem further in a cyclical fashion.   These are the same pts (now being labeled as having "irritable larynx syndrome" by some cough centers) who not infrequently have a history of having failed to tolerate ace inhibitors,  dry powder inhalers or biphosphonates or report having atypical/extraesophageal reflux symptoms that don't respond to standard doses of PPI  and are easily confused as having aecopd or asthma flares by even experienced allergists/ pulmonologists (myself included).   Of the three most common causes of  Sub-acute / recurrent or chronic cough, only one (GERD)  can actually contribute to/ trigger  the other two (asthma and post nasal drip syndrome)  and perpetuate the cylce of cough.  While not intuitively obvious, many patients with chronic low grade reflux do not cough until there is a primary insult that disturbs the protective epithelial barrier and exposes  sensitive nerve endings.   This is typically viral but can due to PNDS and  either may apply here.   The point is that once this occurs, it is difficult to eliminate the cycle  using anything but a maximally effective acid suppression regimen at least in the short run, accompanied by an appropriate diet to address non acid GERD and control / eliminate the cough itself for at least 3 days with tyl #3 and 1st gen H1 blockers per guidelines

## 2017-09-02 ENCOUNTER — Ambulatory Visit (INDEPENDENT_AMBULATORY_CARE_PROVIDER_SITE_OTHER)
Admission: RE | Admit: 2017-09-02 | Discharge: 2017-09-02 | Disposition: A | Discharge status: Home / Self Care | Payer: Federal, State, Local not specified - PPO | Source: Ambulatory Visit | Attending: Internal Medicine | Admitting: Internal Medicine

## 2017-09-02 DIAGNOSIS — R05 Cough: Secondary | ICD-10-CM | POA: Diagnosis not present

## 2017-09-02 DIAGNOSIS — J45991 Cough variant asthma: Secondary | ICD-10-CM | POA: Diagnosis not present

## 2017-09-02 DIAGNOSIS — R059 Cough, unspecified: Secondary | ICD-10-CM

## 2017-09-02 NOTE — Progress Notes (Signed)
Spoke with pt and notified of results per Dr. Wert. Pt verbalized understanding and denied any questions. 

## 2017-09-23 ENCOUNTER — Ambulatory Visit (INDEPENDENT_AMBULATORY_CARE_PROVIDER_SITE_OTHER): Payer: Federal, State, Local not specified - PPO | Admitting: Internal Medicine

## 2017-09-23 ENCOUNTER — Encounter: Payer: Self-pay | Admitting: Internal Medicine

## 2017-09-23 VITALS — BP 134/90 | HR 83 | Ht 63.75 in | Wt 225.6 lb

## 2017-09-23 DIAGNOSIS — J45991 Cough variant asthma: Secondary | ICD-10-CM

## 2017-09-23 NOTE — Progress Notes (Signed)
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     History of Present Illness  06/10/2017 1st [facility] office visit/ Wert   Chief Complaint  Patient presents with  . Pulmonary Consult    [facility]. She states she was dxed with Asthma a few years ago.  She c/o SOB that comes and goes without any specific triggers. She uses proair 3 x per wk on average.   variable sob / sometimes at rest for one breath, then resolves, rarely lasts more than a few secs  s assoc cough or wheeze/ assoc overt hb Walking limited pain in knees  [facility]> sleeps fine s aware of problem Change prilosec to 20 mg x 2  = 40 mg Take 30- 60 min before your first and last meals of the day and contine zantac 150 mg at bedtime  Instead of your present allergy pill try For drainage / throat tickle try take CHLORPHENIRAMINE  4 mg - take one every 4 hours as needed - available over the counter- may cause drowsiness so start with just a bedtime dose or two and see how you tolerate it before trying in daytime      GERD diet     07/22/2017  f/u ov/Wert re: uacs using mint lifesavers Chief Complaint  Patient presents with  . Follow-up    Breathing is unchanged. She states she has not used her rescue inhaler since the last visit.   Dyspnea:  Bike once a week x 5 min then stops s symptoms  Cough: better but still in am one a tsp or two white mucus   And persistent  sensation of pnds daytime but no mucus produced other than first thing ing am but doesn't disturb sleep Sleep: on cpap  SABA use:  None since last ov Only using h1 hs none daytime rec Continue omeprazole 40 mg Take 30- 60 min before your first and last meals of the day  Take zantac = ranitidine at 150 mg at bedtime  If still having bad heartburn then use gaviscon liquid if really can't avoid the foods / drinks that cause it  For drainage / throat tickle try take CHLORPHENIRAMINE  4 mg - take one every 4 hours as needed - available over the counter-   GERD  Diet  schedule methacholine challenge testing > neg  Bring chest x-ray report from [place]    08/22/2017  f/u ov/Wert re:  uacs Chief Complaint  Patient presents with  . Follow-up    Discuss MCT results. She states increased cough for the past wk- went to UC while at [facility] and was given cough syrup, tessalon and amox. She is still on abx and the cough has only improved slightly. She is producing some white sputum.  She has tried her albuterol  inhaler but it has not helped so she stopped using it.   Dyspnea:  20 min on bike s stopping  Cough: was the same/ not provoked by mct / did not take the chlor more than once a day Sleep: fine on cpap SABA use: not helping/ not using   Acutely ill one week prior to OV  Cough much worse / hoarse > strep neg/ rx amox / codeine > tessalon  Produces sputum all day long mucoid but no longer excessive or purulent   Gen chest anterior aching p coughing fits  rec Take mucinex dm 1200 mg  every 12 hours and supplement if needed with  Tylenol 3#  up to 1- 2 every 4 hours Once you have eliminated the cough for 3 straight days try reducing the tylenol #3  first,  then the delsym as tolerated.   Prednisone 10 mg take  4 each am x 2 days,   2 each am x 2 days,  1 each am x 2 days and stop  For drainage / throat tickle try take CHLORPHENIRAMINE  4 mg - take one-two every 4 hours as needed -  schedule sinus  CT > neg Please schedule a follow up office visit in 4 weeks, sooner if needed  with all medications /inhalers/ solutions in hand so we can verify exactly what you are taking. This includes all medications from all doctors and over the counters - separate into two bags:  Automatic vs as needed    09/23/2017  f/u ov/Wert re:  Chief Complaint  Patient presents with  . Follow-up    Breathing is doing well. She states she rarely uses proair.   Dyspnea:  Not limited by breathing from desired activities   Cough: gone to her satisfaction / better with h1 2 Sleep: ok without  cpap  SABA use:  Rare   No obvious day to day or daytime variability or assoc excess/ purulent sputum or mucus plugs or hemoptysis or cp or chest tightness, subjective wheeze or overt sinus or hb symptoms. No unusual exposure hx or h/o childhood pna/ asthma or knowledge of premature birth.  Sleeping fine1-2 pillows  without nocturnal  or early am exacerbation  of respiratory  c/o's or need for noct saba. Also denies any obvious fluctuation of symptoms with weather or environmental changes or other aggravating or alleviating factors except as outlined above   Current Allergies, Complete Past Medical History, Past Surgical History, Family History, and Social History were reviewed in [employer] record.  ROS  The following are not active complaints unless bolded Hoarseness, sore throat, dysphagia, dental problems, itching, sneezing,  nasal congestion or discharge of excess mucus or purulent secretions, ear ache,   fever, chills, sweats, unintended wt loss or wt gain, classically pleuritic or exertional cp,  orthopnea pnd or arm/hand swelling  or leg swelling, presyncope, palpitations, abdominal pain, anorexia, nausea, vomiting, diarrhea  or change in bowel habits or change in bladder habits, change in stools or change in urine, dysuria, hematuria,  rash, arthralgias, visual complaints, headache, numbness,  weakness or ataxia or problems with walking or coordination,  change in mood or  memory.        Current Meds  Medication Sig  . albuterol (PROAIR HFA) 108 (90 Base) MCG/ACT inhaler Inhale 2 puffs into the lungs every 6 (six) hours as needed for wheezing or shortness of breath.  . ALPRAZolam (XANAX) 1 MG tablet Take 1 mg by mouth 2 (two) times  daily as needed for anxiety.  Marland Kitchen. amoxicillin (AMOXIL) 875 MG tablet Take 875 mg by mouth 2 (two) times daily.  Marland Kitchen. buPROPion (WELLBUTRIN XL) 300 MG 24 hr tablet Take 300 mg by mouth daily.  . busPIRone (BUSPAR) 10 MG tablet Take 10 mg by mouth 3 (three) times daily.  . chlorpheniramine (CHLOR-TRIMETON) 4 MG tablet Take 4 mg by mouth 2 (two) times daily as needed for allergies.  . cyclobenzaprine (FLEXERIL) 10 MG tablet Take 5 mg by mouth 2 (two) times daily.  Marland Kitchen. etodolac (LODINE) 400 MG tablet Take 400 mg by mouth 3 (three) times daily.  . fluticasone (FLONASE) 50 MCG/ACT nasal spray Place 2 sprays into both nostrils daily as needed for allergies or rhinitis.  Marland Kitchen. gabapentin (NEURONTIN) 300 MG capsule Take 300 mg by mouth 3 (three) times daily.  Marland Kitchen. HYDROcodone-acetaminophen (NORCO/VICODIN) 5-325 MG tablet Take 1 tablet by mouth every 6 (six) hours as needed for moderate pain.  Marland Kitchen. levothyroxine (SYNTHROID, LEVOTHROID) 125 MCG tablet Take 125 mcg by mouth daily before breakfast.  . losartan (COZAAR) 25 MG tablet Take 12.5 mg by mouth daily. "when I remember"  . metFORMIN (GLUCOPHAGE-XR) 750 MG 24 hr tablet Take 750 mg by mouth daily with breakfast.  . naproxen sodium (ALEVE) 220 MG tablet Take 220 mg by mouth daily as needed.  Marland Kitchen. omeprazole (PRILOSEC) 40 MG capsule Take 30- 60 min before your first and last meals of the day  . Phenylephrine-Acetaminophen (EXCEDRIN SINUS HEADACHE PO) Take by mouth as needed.  . prazosin (MINIPRESS) 2 MG capsule Take 2 mg by mouth at bedtime.  . ranitidine (ZANTAC) 150 MG capsule Take 150 mg by mouth every evening.   . SUMAtriptan  (IMITREX) 100 MG tablet Take 100 mg by mouth every 2 (two) hours as needed for migraine. May repeat in 2 hours if headache persists or recurs.  . Vilazodone HCl (VIIBRYD) 40 MG TABS Take 40 mg by mouth daily.  . Vitamin D, Ergocalciferol, (DRISDOL) 50000 units CAPS capsule Take 50,000 Units by mouth every 7 (seven) days.              Objective:   Physical Exam  amb bf nad   09/23/2017          225   08/22/2017         227  07/22/17 232 lb 9.6 oz (105.5 kg)  06/10/17 232 lb (105.2 kg)     Vital signs reviewed - Note on arrival 02 sats  98% on RA    HEENT: nl dentition, turbinates bilaterally, and oropharynx. Nl external ear canals without cough reflex   NECK :  without JVD/Nodes/TM/ nl carotid upstrokes bilaterally   LUNGS: no acc muscle use,  Nl contour chest which is clear to A and P bilaterally without cough on insp or exp maneuvers   CV:  RRR  no s3 or murmur or increase in P2, and no edema   ABD:  Obese/ soft and nontender with nl inspiratory excursion in the supine position. No bruits or organomegaly appreciated, bowel sounds nl  MS:  Nl gait/ ext warm without deformities, calf tenderness, cyanosis or clubbing No obvious joint restrictions   SKIN: warm and dry without lesions    NEURO:  alert, approp, nl sensorium with  no motor or cerebellar deficits apparent.                  Assessment:

## 2017-09-23 NOTE — Patient Instructions (Signed)
If you are satisfied with your treatment plan,  let your doctor know and he/she can either refill your medications or you can return here when your prescription runs out.     If in any way you are not 100% satisfied,  please tell us.  If 100% better, tell your friends!  Pulmonary follow up is as needed   

## 2017-09-24 ENCOUNTER — Encounter: Payer: Self-pay | Admitting: Internal Medicine

## 2017-09-24 NOTE — Assessment & Plan Note (Addendum)
Spirometry  8//16/16  FEV1 2.43 (118%0 and ratio 85 with nl insp/exp f/v loop  Spirometry 06/10/2017  FEV1 2.41 (119%)  Ratio 86   Allergy profile 06/10/2017 >  Eos 0.2 /  IgE  26 RAST pos box elders only  - max rx for gerd and 1st gen H1 blockers per guidelines  06/10/2017 > improved 07/22/2017 but still sense of pnds not using h1 but at hs/ still using lots of mints - MCT 08/05/17 > neg   - Allergy profile 08/22/2017 >  Eos 0.2 /  IgE  26 RAST pos only for box elders - Sinus CT 09/02/2017 >>> neg - 09/23/2017  After extensive coaching inhaler device  effectiveness =   75%   I had an extended final summary discussion with the patient reviewing all relevant studies completed to date and  lasting 15 to 20 minutes of a 25 minute visit on the following issues:   Continue to strongly favor uacs over asthma cough but needs to have saba on hand in case of flare from uri or irritant exposure/ pulmonary f/u can be prn   Each maintenance medication was reviewed in detail including most importantly the difference between maintenance and as needed and under what circumstances the prns are to be used.  Please see AVS for specific  Instructions which are unique to this visit and I personally typed out  which were reviewed in detail in writing with the patient and a copy provided.    See device teaching which extended face to face time for this visit   Pulmonary f/u is prn

## 2017-11-05 ENCOUNTER — Other Ambulatory Visit: Payer: Self-pay | Admitting: Internal Medicine

## 2017-11-18 ENCOUNTER — Other Ambulatory Visit: Payer: Self-pay | Admitting: Internal Medicine

## 2018-01-01 ENCOUNTER — Encounter: Payer: Self-pay | Admitting: Neurology

## 2018-03-10 ENCOUNTER — Encounter (HOSPITAL_COMMUNITY): Payer: Self-pay

## 2018-03-10 ENCOUNTER — Ambulatory Visit (INDEPENDENT_AMBULATORY_CARE_PROVIDER_SITE_OTHER): Payer: Federal, State, Local not specified - PPO

## 2018-03-10 ENCOUNTER — Ambulatory Visit (HOSPITAL_COMMUNITY)
Admission: EM | Admit: 2018-03-10 | Discharge: 2018-03-10 | Disposition: A | Discharge status: Home / Self Care | Payer: Federal, State, Local not specified - PPO | Attending: Emergency Medicine | Admitting: Emergency Medicine

## 2018-03-10 DIAGNOSIS — J189 Pneumonia, unspecified organism: Secondary | ICD-10-CM

## 2018-03-10 DIAGNOSIS — J181 Lobar pneumonia, unspecified organism: Secondary | ICD-10-CM | POA: Diagnosis not present

## 2018-03-10 MED ORDER — BENZONATATE 100 MG PO CAPS: 100.0000 mg | ORAL_CAPSULE | Freq: Three times a day (TID) | ORAL | 0 refills | Status: DC

## 2018-03-10 MED ORDER — IPRATROPIUM-ALBUTEROL 0.5-2.5 (3) MG/3ML IN SOLN
3.0000 mL | Freq: Once | RESPIRATORY_TRACT | Status: AC
  Administered 2018-03-10: 3 mL via RESPIRATORY_TRACT

## 2018-03-10 MED ORDER — ALBUTEROL SULFATE HFA 108 (90 BASE) MCG/ACT IN AERS: 1.0000 | INHALATION_SPRAY | Freq: Four times a day (QID) | RESPIRATORY_TRACT | 0 refills | Status: AC | PRN

## 2018-03-10 MED ORDER — IPRATROPIUM-ALBUTEROL 0.5-2.5 (3) MG/3ML IN SOLN
RESPIRATORY_TRACT | Status: AC
  Filled 2018-03-10: qty 3

## 2018-03-10 MED ORDER — AZITHROMYCIN 250 MG PO TABS: ORAL_TABLET | ORAL | 0 refills | Status: AC

## 2018-03-10 NOTE — Discharge Instructions (Addendum)
Your xray is concerning for pneumonia today so I have started you on antibiotics.  I have also sent cough capsules which you may take to help with cough symptoms.  Mucinex as an expectorant may also be helpful.  If symptoms worsen or do not improve in the next week to return to be seen or to follow up with PCP.  Use of inhaler as needed.  Please follow up with your primary care provider for recheck of symptoms in the next 1-2 weeks, you may need repeat chest xray in 3-4 weeks to ensure resolution.  Please go to ER if develop worsening of shortness of breath ,difficulty breathing, or chest pain .

## 2018-03-10 NOTE — ED Triage Notes (Signed)
Pt presents with a persistent barky cough with mucus and chest congestion.

## 2018-03-10 NOTE — ED Provider Notes (Signed)
[facility]    CSN: [id number] Arrival date & time: 03/10/18  1222     History   Chief Complaint Chief Complaint  Patient presents with  . Cough  . Congestion    HPI Nyhla Burgess is a 62 y.o. female.   Natalie Burgess presents with complaints of productive cough which started 11/13. She feels occasionally shortness of breath . Right ear pain. Slight nasal congestion which started today. No particular fevers. No body aches or chills. No sore throat. No rash. Without contributing medical history.  No asthma/copd history. Doesn't smoke. Has tried mucinex and left over cough syrup with codeine which have not helped. Hx of anemia, DM, thyroid disease.    ROS per HPI.      Past Medical History:  Diagnosis Date  . Anemia   . Diabetes mellitus without complication (Natalie Burgess)   . Thyroid disease     Patient Active Problem List   Diagnosis Date Noted  . Dyspnea on exertion 06/11/2017  . Morbid obesity due to excess calories (Natalie Burgess) 06/11/2017  . Cough variant asthma vs uacs  06/10/2017    Past Surgical History:  Procedure Laterality Date  . CESAREAN SECTION    . CHOLECYSTECTOMY    . THYROIDECTOMY      OB History   None      Home Medications    Prior to Admission medications   Medication Sig Start Date End Date Taking? Authorizing Provider  albuterol (PROAIR HFA) 108 (90 Base) MCG/ACT inhaler Inhale 1-2 puffs into the lungs every 6 (six) hours as needed for wheezing or shortness of breath. 03/10/18   Georgetta Haber, NP  ALPRAZolam Prudy Feeler) 1 MG tablet Take 1 mg by mouth 2 (two) times daily as needed for anxiety.    [provider]  azithromycin (ZITHROMAX) 250 MG tablet Take 2 tablets (500 mg total) by mouth daily for 1 day, THEN 1 tablet (250 mg total) daily for 4 days. 03/10/18 03/15/18  Georgetta Haber, NP  benzonatate (TESSALON) 100 MG capsule Take 1 capsule (100 mg total) by mouth every 8 (eight) hours. 03/10/18   Georgetta Haber, NP  buPROPion  (WELLBUTRIN XL) 300 MG 24 hr tablet Take 300 mg by mouth daily.    [provider]  busPIRone (BUSPAR) 10 MG tablet Take 10 mg by mouth 3 (three) times daily.    [provider]  chlorpheniramine (CHLOR-TRIMETON) 4 MG tablet Take 4 mg by mouth 2 (two) times daily as needed for allergies.    [provider]  cyclobenzaprine (FLEXERIL) 10 MG tablet Take 5 mg by mouth 2 (two) times daily.    [provider]  etodolac (LODINE) 400 MG tablet Take 400 mg by mouth 3 (three) times daily.    [provider]  fluticasone (FLONASE) 50 MCG/ACT nasal spray Place 2 sprays into both nostrils daily as needed for allergies or rhinitis.    [provider]  gabapentin (NEURONTIN) 300 MG capsule Take 300 mg by mouth 3 (three) times daily.    [provider]  HYDROcodone-acetaminophen (NORCO/VICODIN) 5-325 MG tablet Take 1 tablet by mouth every 6 (six) hours as needed for moderate pain.    [provider]  levothyroxine (SYNTHROID, LEVOTHROID) 125 MCG tablet Take 125 mcg by mouth daily before breakfast.    [provider]  losartan (COZAAR) 25 MG tablet Take 12.5 mg by mouth daily. "when I remember"    [provider]  metFORMIN (GLUCOPHAGE-XR) 750 MG 24 hr tablet  Take 750 mg by mouth daily with breakfast.    [provider]  naproxen sodium (ALEVE) 220 MG tablet Take 220 mg by mouth daily as needed.    [provider]  omeprazole (PRILOSEC) 40 MG capsule TAKE 1 CAPSULE BY MOUTH 30-60 MINS BEFORE YOUR FIRST AND LAST MEALS OF THE DAY 11/18/17   Nyoka Cowden, MD  Phenylephrine-Acetaminophen [facility] SINUS HEADACHE PO) Take by mouth as needed.    [provider]  prazosin (MINIPRESS) 2 MG capsule Take 2 mg by mouth at bedtime.    [provider]  ranitidine (ZANTAC) 150 MG capsule Take 150 mg by mouth every evening.     [provider]  SUMAtriptan (IMITREX) 100 MG tablet Take 100 mg by  mouth every 2 (two) hours as needed for migraine. May repeat in 2 hours if headache persists or recurs.    [provider]  Vilazodone HCl (VIIBRYD) 40 MG TABS Take 40 mg by mouth daily.    [provider]  Vitamin D, Ergocalciferol, (DRISDOL) 50000 units CAPS capsule Take 50,000 Units by mouth every 7 (seven) days.    [provider]    Family History History reviewed. No pertinent family history.  Social History Social History   Tobacco Use  . Smoking status: Former Smoker    Packs/day: 0.25    Years: 13.00    Pack years: 3.25    Types: Cigarettes, Pipe    Last attempt to quit: 04/23/1990    Years since quitting: 27.8  . Smokeless tobacco: Never Used  Substance Use Topics  . Alcohol use: Yes    Comment: social  . Drug use: No     Allergies   Ptu [propylthiouracil]   Review of Systems Review of Systems   Physical Exam Triage Vital Signs ED Triage Vitals  Enc Vitals Group     BP 03/10/18 1316 136/75     Pulse Rate 03/10/18 1316 92     Resp 03/10/18 1316 18     Temp 03/10/18 1316 98.5 F (36.9 C)     Temp Source 03/10/18 1316 Oral     SpO2 03/10/18 1316 95 %     Weight --      Height --      Head Circumference --      Peak Flow --      Pain Score 03/10/18 1317 5     Pain Loc --      Pain Edu? --      Excl. in GC? --    No data found.  Updated Vital Signs BP 136/75 (BP Location: Right Arm)   Pulse 92   Temp 98.5 F (36.9 C) (Oral)   Resp 18   SpO2 95%   Visual Acuity Right Eye Distance:   Left Eye Distance:   Bilateral Distance:    Right Eye Near:   Left Eye Near:    Bilateral Near:     Physical Exam  Constitutional: She is oriented to person, place, and time. She appears well-developed and well-nourished. No distress.  HENT:  Head: Normocephalic and atraumatic.  Right Ear: Tympanic membrane, external ear and ear canal normal.  Left Ear: Tympanic membrane, external ear and ear canal normal.  Nose: Nose normal.    Mouth/Throat: Uvula is midline, oropharynx is clear and moist and mucous membranes are normal. No tonsillar exudate.  Eyes: Pupils are equal, round, and reactive to light. Conjunctivae and EOM are normal.  Cardiovascular: Normal rate, regular rhythm  and normal heart sounds.  Pulmonary/Chest: Effort normal and breath sounds normal.  Significant strong moist productive cough noted, illicited with deep inspiration  Neurological: She is alert and oriented to person, place, and time.  Skin: Skin is warm and dry.     UC Treatments / Results  Labs (all labs ordered are listed, but only abnormal results are displayed) Labs Reviewed - No data to display  EKG None  Radiology Dg Chest 2 View  Result Date: 03/10/2018 CLINICAL DATA:  Per pt: sick for three days, productive cough, sputum is yellow, no fever. History of Bronchitis. No history of cardiac disease. No HBP. Patient is a diabetic. CXR post breathing treatmentproductive cough and sob EXAM: CHEST - 2 VIEW COMPARISON:  None. FINDINGS: Normal cardiac silhouette. There is a RIGHT middle lobe density. Mild LEFT basilar atelectasis. Upper lungs clear. IMPRESSION: RIGHT middle lobe pneumonia. Followup PA and lateral chest X-ray is recommended in 3-4 weeks following trial of antibiotic therapy to ensure resolution and exclude underlying malignancy. Electronically Signed   By: Genevive BiStewart  Edmunds M.D.   On: 03/10/2018 14:57    Procedures Procedures (including critical care time)  Medications Ordered in UC Medications  ipratropium-albuterol (DUONEB) 0.5-2.5 (3) MG/3ML nebulizer solution 3 mL (3 mLs Nebulization Given 03/10/18 1410)    Initial Impression / Assessment and Plan / UC Course  I have reviewed the triage vital signs and the nursing notes.  Pertinent labs & imaging results that were available during my care of the patient were reviewed by me and considered in my medical decision making (see chart for details).     Right middle lobe  pneumonia on chest xray. Course of azithromycin provided. Return precautions provided. Encouraged close follow up with PCP for monitoring and recheck. Patient verbalized understanding and agreeable to plan.  Ambulatory out of clinic without difficulty.    Final Clinical Impressions(s) / UC Diagnoses   Final diagnoses:  Community acquired pneumonia of right middle lobe of lung [facility](Natalie Burgess)     Discharge Instructions     Your xray is concerning for pneumonia today so I have started you on antibiotics.  I have also sent cough capsules which you may take to help with cough symptoms.  Mucinex as an expectorant may also be helpful.  If symptoms worsen or do not improve in the next week to return to be seen or to follow up with PCP.  Use of inhaler as needed.  Please follow up with your primary care provider for recheck of symptoms in the next 1-2 weeks, you may need repeat chest xray in 3-4 weeks to ensure resolution.  Please go to ER if develop worsening of shortness of breath ,difficulty breathing, or chest pain .     ED Prescriptions    Medication Sig Dispense Auth. Provider   albuterol (PROAIR HFA) 108 (90 Base) MCG/ACT inhaler Inhale 1-2 puffs into the lungs every 6 (six) hours as needed for wheezing or shortness of breath. 1 Inhaler Yajaira Doffing, Dorene GrebeNatalie B, NP   azithromycin (ZITHROMAX) 250 MG tablet Take 2 tablets (500 mg total) by mouth daily for 1 day, THEN 1 tablet (250 mg total) daily for 4 days. 6 tablet Linus MakoBurky, Deamber Buckhalter B, NP   benzonatate (TESSALON) 100 MG capsule Take 1 capsule (100 mg total) by mouth every 8 (eight) hours. 21 capsule Georgetta HaberBurky, Charlayne Vultaggio B, NP     Controlled Substance Prescriptions [place] Controlled Substance Registry consulted? Not Applicable   Georgetta HaberBurky, Hermes Wafer B, NP 03/10/18 1510

## 2018-04-02 ENCOUNTER — Ambulatory Visit (INDEPENDENT_AMBULATORY_CARE_PROVIDER_SITE_OTHER): Payer: No Typology Code available for payment source | Admitting: Neurology

## 2018-04-02 ENCOUNTER — Encounter: Payer: Self-pay | Admitting: Neurology

## 2018-04-02 VITALS — BP 122/88 | HR 92 | Ht 63.25 in | Wt 230.0 lb

## 2018-04-02 DIAGNOSIS — M5417 Radiculopathy, lumbosacral region: Secondary | ICD-10-CM

## 2018-04-02 DIAGNOSIS — M5410 Radiculopathy, site unspecified: Secondary | ICD-10-CM | POA: Diagnosis not present

## 2018-04-02 NOTE — Patient Instructions (Addendum)
We will order MRI lumbar spine without contrast and call you with the results  We [facility] and they will call you directly to schedule your appt. They are located at [address] If you need to contact them directly please call [phone].

## 2018-04-02 NOTE — Progress Notes (Signed)
[facility] Clinic Note - Initial Visit   Date: 04/02/18  Natalie Burgess MRN: [id number] DOB: 07/30/1955   Dear Dr. Haig ProphetBeers:  Thank you for your kind referral of Natalie Burgess for consultation of left leg pain. Although her history is well known to you, please allow Natalie Burgess to reiterate it for the purpose of our medical record. The patient was accompanied to the clinic by self.    History of Present Illness: Natalie Burgess is a 62 y.o. right-handed [place] female with diabetes mellitus, hypothyroidism, GERD, hypertension, migraine, and depression presenting for evaluation of left leg pain and numbness.    Starting around 2012, she began having episodic left buttocks pain and tingling/numbness over the left lateral thigh, which radiates into the lateral leg.  This occurs about 2-3 times per week, and is triggered by prolonged standing or walking (>10 minutes).  Resting and sitting tends to alleviate her discomfort.  She does not have leg weakness or similar symptoms on the right leg.  She has been given flexeril which helps her back, but does not have any benefit for leg pain. She completed physical therapy for her back this year, which did not provide any relief.  She also takes gabapentin 600mg  BID for hot flashes and is not aware that it is used for nerve pain.   She has intermittent tingling of the feet and has been told she has neuropathy.  She has not had NCS/EMG of the legs or MRI lumbar spine.    She works at the [employer] for 19 years.  She was 3 years in [employer] and discharged in 1982.   Out-side paper records, electronic medical record, and images have been reviewed where available and summarized as:  Labs 12/2017:  HbA1c 7.8 *  Past Medical History:  Diagnosis Date  . Anemia   . Diabetes mellitus without complication (HCC)   . Thyroid disease     Past Surgical History:  Procedure Laterality Date  . CESAREAN SECTION    . CHOLECYSTECTOMY    .  THYROIDECTOMY       Medications:  Outpatient Encounter Medications as of 04/02/2018  Medication Sig  . albuterol (PROAIR HFA) 108 (90 Base) MCG/ACT inhaler Inhale 1-2 puffs into the lungs every 6 (six) hours as needed for wheezing or shortness of breath.  . ALPRAZolam (XANAX) 1 MG tablet Take 1 mg by mouth 2 (two) times daily as needed for anxiety.  . benzonatate (TESSALON) 100 MG capsule Take 1 capsule (100 mg total) by mouth every 8 (eight) hours.  Marland Kitchen. buPROPion (WELLBUTRIN XL) 300 MG 24 hr tablet Take 300 mg by mouth daily.  . busPIRone (BUSPAR) 10 MG tablet Take 10 mg by mouth 3 (three) times daily.  . chlorpheniramine (CHLOR-TRIMETON) 4 MG tablet Take 4 mg by mouth 2 (two) times daily as needed for allergies.  . cyclobenzaprine (FLEXERIL) 10 MG tablet Take 5 mg by mouth 2 (two) times daily.  Marland Kitchen. etodolac (LODINE) 400 MG tablet Take 400 mg by mouth 3 (three) times daily.  . fluticasone (FLONASE) 50 MCG/ACT nasal spray Place 2 sprays into both nostrils daily as needed for allergies or rhinitis.  Marland Kitchen. gabapentin (NEURONTIN) 300 MG capsule Take 300 mg by mouth 2 (two) times daily as needed.   Marland Kitchen. HYDROcodone-acetaminophen (NORCO/VICODIN) 5-325 MG tablet Take 1 tablet by mouth every 6 (six) hours as needed for moderate pain.  Marland Kitchen. levothyroxine (SYNTHROID, LEVOTHROID) 125 MCG tablet Take 125 mcg by mouth daily before breakfast.  .  losartan (COZAAR) 25 MG tablet Take 12.5 mg by mouth 2 (two) times daily. "when I remember"   . metFORMIN (GLUCOPHAGE-XR) 750 MG 24 hr tablet Take 750 mg by mouth daily with breakfast.  . naproxen sodium (ALEVE) 220 MG tablet Take 220 mg by mouth daily as needed.  Marland Kitchen omeprazole (PRILOSEC) 40 MG capsule TAKE 1 CAPSULE BY MOUTH 30-60 MINS BEFORE YOUR FIRST AND LAST MEALS OF THE DAY  . Phenylephrine-Acetaminophen (EXCEDRIN SINUS HEADACHE PO) Take by mouth as needed.  . prazosin (MINIPRESS) 2 MG capsule Take 2 mg by mouth at bedtime.  . SUMAtriptan (IMITREX) 100 MG tablet Take 100  mg by mouth every 2 (two) hours as needed for migraine. May repeat in 2 hours if headache persists or recurs.  . Vilazodone HCl (VIIBRYD) 40 MG TABS Take 40 mg by mouth daily.  . [DISCONTINUED] ranitidine (ZANTAC) 150 MG capsule Take 150 mg by mouth every evening.   . [DISCONTINUED] Vitamin D, Ergocalciferol, (DRISDOL) 50000 units CAPS capsule Take 50,000 Units by mouth every 7 (seven) days.   No facility-administered encounter medications on file as of 04/02/2018.      Allergies:  Allergies  Allergen Reactions  . Ptu [Propylthiouracil] Other (See Comments)    Caused hepatitis    Family History: Family History  Problem Relation Age of Onset  . Hypertension Mother   . Diabetes Mother   . Heart attack Father   . Other Sister        no contact, health status unknown  . HIV/AIDS Brother   . Heart attack Brother   . HIV/AIDS Daughter   . Asthma Daughter   . Hypertension Daughter   . Anxiety disorder Daughter   . Other Brother        no contact, health status unknown    Social History: Social History   Tobacco Use  . Smoking status: Former Smoker    Packs/day: 0.25    Years: 13.00    Pack years: 3.25    Types: Cigarettes, Pipe    Last attempt to quit: 04/23/1990    Years since quitting: 27.9  . Smokeless tobacco: Never Used  Substance Use Topics  . Alcohol use: Yes    Comment: , beer, wine or cocktails  . Drug use: No   Social History   Social History Narrative   Patient is right-handed. Her granddaughter lives with her in a single level home, with a few steps to enter. She rides a stationary bike 1-2 x a week for exercise.    Review of Systems:  CONSTITUTIONAL: No fevers, chills, night sweats, or weight loss.   EYES: No visual changes or eye pain ENT: No hearing changes.  No history of nose bleeds.   RESPIRATORY: No cough, wheezing and shortness of breath.   CARDIOVASCULAR: Negative for chest pain, and palpitations.   GI: Negative for abdominal discomfort,  blood in stools or black stools.  No recent change in bowel habits.   GU:  No history of incontinence.   MUSCLOSKELETAL: + history of joint pain or swelling.  No myalgias.   SKIN: Negative for lesions, rash, and itching.   HEMATOLOGY/ONCOLOGY: Negative for prolonged bleeding, bruising easily, and swollen nodes.  No history of cancer.   ENDOCRINE: Negative for cold or heat intolerance, polydipsia or goiter.   PSYCH:  +depression or anxiety symptoms.   NEURO: As Above.   Vital Signs:  BP 122/88   Pulse 92   Ht 5' 3.25" (1.607 m)   Wt  230 lb (104.3 kg)   SpO2 97%   BMI 40.42 kg/m    General Medical Exam:   General:  Well appearing, comfortable.   Eyes/ENT: see cranial nerve examination.   Neck: No masses appreciated.  Full range of motion without tenderness.  No carotid bruits. Respiratory:  Clear to auscultation, good air entry bilaterally.   Cardiac:  Regular rate and rhythm, no murmur.   Extremities:  No deformities, edema, or skin discoloration.  Skin:  No rashes or lesions.  Neurological Exam: MENTAL STATUS including orientation to time, place, person, recent and remote memory, attention span and concentration, language, and fund of knowledge is normal.  Speech is not dysarthric.  CRANIAL NERVES: II:  No visual field defects.  Unremarkable fundi.   III-IV-VI: Pupils equal round and reactive to light.  Normal conjugate, extra-ocular eye movements in all directions of gaze.  No nystagmus.  No ptosis.   V:  Normal facial sensation.     VII:  Normal facial symmetry and movements.   VIII:  Normal hearing and vestibular function.   IX-X:  Normal palatal movement.   XI:  Normal shoulder shrug and head rotation.   XII:  Normal tongue strength and range of motion, no deviation or fasciculation.  MOTOR:  No atrophy, fasciculations or abnormal movements.  No pronator drift.  Tone is normal.    Right Upper Extremity:    Left Upper Extremity:    Deltoid  5/5   Deltoid  5/5   Biceps   5/5   Biceps  5/5   Triceps  5/5   Triceps  5/5   Wrist extensors  5/5   Wrist extensors  5/5   Wrist flexors  5/5   Wrist flexors  5/5   Finger extensors  5/5   Finger extensors  5/5   Finger flexors  5/5   Finger flexors  5/5   Dorsal interossei  5/5   Dorsal interossei  5/5   Abductor pollicis  5/5   Abductor pollicis  5/5   Tone (Ashworth scale)  0  Tone (Ashworth scale)  0   Right Lower Extremity:    Left Lower Extremity:    Hip flexors  5/5   Hip flexors  5/5   Hip extensors  5/5   Hip extensors  5/5   Knee flexors  5/5   Knee flexors  5/5   Knee extensors  5/5   Knee extensors  5/5   Dorsiflexors  5/5   Dorsiflexors  5/5   Plantarflexors  5/5   Plantarflexors  5/5   Toe extensors  5/5   Toe extensors  5/5   Toe flexors  5/5   Toe flexors  5/5   Tone (Ashworth scale)  0  Tone (Ashworth scale)  0   MSRs:  Right                                                                 Left brachioradialis 1+  brachioradialis 1+  biceps 1+  biceps 1+  triceps 1+  triceps 1+  patellar 2+  patellar 2+  ankle jerk 1+  ankle jerk 1+  Hoffman no  Hoffman no  plantar response down  plantar response down   SENSORY:  Normal and symmetric  perception of light touch, pinprick, vibration, and proprioception.  Romberg's sign absent.   COORDINATION/GAIT: Normal finger-to- nose-finger and heel-to-shin.  Intact rapid alternating movements bilaterally.  Able to rise from a chair without using arms.  Gait narrow based and stable. Tandem and stressed gait intact.    IMPRESSION: Left radicular leg pain, possibly due to L4-5 radiculopathy/canal stenosis. Exam show brisk patella jerks and otherwise normal sensation and motor strength.  Patient reassured that her exam does not show signs of diabetic neuropathy. She has completed physical therapy with no benefit, so will plan for MRI lumbar spine to assess for structural pathology.  She will be continued on flexeril 10mg  at bedtime and gabapentin 600mg  twice  daily for pain.  I encouraged her to continue her home exercises for back strengthening.  Further recommendations based on her MRI results.  If imaging is not diagnostic, the next step his NCS/EMG of the left leg.     Thank you for allowing me to participate in patient's care.  If I can answer any additional questions, I would be pleased to do so.    Sincerely,    Kariann Wecker K. Allena Katz, DO

## 2018-04-20 ENCOUNTER — Other Ambulatory Visit: Payer: Non-veteran care

## 2018-05-18 ENCOUNTER — Other Ambulatory Visit: Payer: Non-veteran care

## 2018-05-19 ENCOUNTER — Telehealth: Payer: Self-pay | Admitting: Obstetrics & Gynecology

## 2018-05-19 NOTE — Telephone Encounter (Signed)
Called patient to give her an appointment. Her mailbox was full. Will send her a reminder letter for her appointment.

## 2018-05-29 ENCOUNTER — Telehealth: Payer: Self-pay | Admitting: Neurology

## 2018-05-29 NOTE — Telephone Encounter (Signed)
Natalie Burgess is needing the last office note from 04/02/18 faxed to them [phone]. Thanks

## 2018-05-29 NOTE — Telephone Encounter (Signed)
Note faxed.

## 2018-06-02 ENCOUNTER — Ambulatory Visit
Admission: RE | Admit: 2018-06-02 | Discharge: 2018-06-02 | Disposition: A | Discharge status: Home / Self Care | Payer: Non-veteran care | Source: Ambulatory Visit | Attending: Neurology | Admitting: Neurology

## 2018-06-02 DIAGNOSIS — M5410 Radiculopathy, site unspecified: Secondary | ICD-10-CM

## 2018-06-02 DIAGNOSIS — M5417 Radiculopathy, lumbosacral region: Secondary | ICD-10-CM

## 2018-06-04 ENCOUNTER — Telehealth: Payer: Self-pay | Admitting: *Deleted

## 2018-06-04 ENCOUNTER — Other Ambulatory Visit: Payer: Self-pay | Admitting: *Deleted

## 2018-06-04 DIAGNOSIS — M5417 Radiculopathy, lumbosacral region: Secondary | ICD-10-CM

## 2018-06-04 DIAGNOSIS — M5410 Radiculopathy, site unspecified: Secondary | ICD-10-CM

## 2018-06-04 NOTE — Telephone Encounter (Signed)
-----   Message from Glendale Chard, DO sent at 06/03/2018 10:00 AM EST ----- Please inform patient that her MRI looks very good and does not show any area of nerve impingement. The next step is NCS/EMG of the left leg to see what is causing her leg pain.  If she is agreeable, please order. Thanks.

## 2018-06-04 NOTE — Telephone Encounter (Signed)
Patient given MRI results and EMG scheduled.

## 2018-06-17 ENCOUNTER — Ambulatory Visit (INDEPENDENT_AMBULATORY_CARE_PROVIDER_SITE_OTHER): Payer: No Typology Code available for payment source | Admitting: Neurology

## 2018-06-17 DIAGNOSIS — M5417 Radiculopathy, lumbosacral region: Secondary | ICD-10-CM | POA: Diagnosis not present

## 2018-06-17 DIAGNOSIS — M5410 Radiculopathy, site unspecified: Secondary | ICD-10-CM

## 2018-06-17 NOTE — Procedures (Signed)
[facility] Neurology  [address], [address]  [place], [place] [address] Tel: [phone] Fax:  [phone] Test Date:  06/17/2018  Patient: Natalie Burgess DOB: 12-18-1955 Physician: Nita Sickle, DO  Sex: Female Height: 5\' 3"  Ref Phys: Nita Sickle, DO  ID#: [id number] Temp: 35.0C Technician:    Patient Complaints: This is a 63 year old female referred for evaluation of left leg pain and paresthesias.  NCV & EMG Findings: Extensive electrodiagnostic testing of the left lower extremity shows:  1. Left sural and superficial peroneal sensory responses are within normal limits. 2. Left peroneal and tibial motor responses are within normal limits. 3. There is no evidence of active or chronic motor axonal loss changes affecting any of the tested muscles.  Motor unit configuration and recruitment pattern is within normal limits.  Impression: This is a normal study of the left lower extremity.  In particular, there is no evidence of a sensorimotor polyneuropathy or lumbosacral radiculopathy   ___________________________ Nita Sickle, DO    Nerve Conduction Studies Anti Sensory Summary Table   Site NR Peak (ms) Norm Peak (ms) P-T Amp (V) Norm P-T Amp  Left Sup Peroneal Anti Sensory (Ant Lat Mall)  35C  12 cm    2.2 <4.6 9.6 >3  Left Sural Anti Sensory (Lat Mall)  35C  Calf    2.8 <4.6 10.2 >3   Motor Summary Table   Site NR Onset (ms) Norm Onset (ms) O-P Amp (mV) Norm O-P Amp Site1 Site2 Delta-0 (ms) Dist (cm) Vel (m/s) Norm Vel (m/s)  Left Peroneal Motor (Ext Dig Brev)  35C  Ankle    2.8 <6.0 4.5 >2.5 B Fib Ankle 7.0 39.0 56 >40  B Fib    9.8  3.8  Poplt B Fib 1.4 8.0 57 >40  Poplt    11.2  3.7         Left Tibial Motor (Abd Hall Brev)  35C  Ankle    4.0 <6.0 6.0 >4 Knee Ankle 8.7 41.0 47 >40  Knee    12.7  3.6          H Reflex Studies   NR H-Lat (ms) Lat Norm (ms) L-R H-Lat (ms)  Left Tibial (Gastroc)  35C     31.70 <35    EMG   Side Muscle Ins Act Fibs Psw  Fasc Number Recrt Dur Dur. Amp Amp. Poly Poly. Comment  Left AntTibialis Nml Nml Nml Nml Nml Nml Nml Nml Nml Nml Nml Nml N/A  Left Gastroc Nml Nml Nml Nml Nml Nml Nml Nml Nml Nml Nml Nml N/A  Left Flex Dig Long Nml Nml Nml Nml Nml Nml Nml Nml Nml Nml Nml Nml N/A  Left RectFemoris Nml Nml Nml Nml Nml Nml Nml Nml Nml Nml Nml Nml N/A  Left GluteusMed Nml Nml Nml Nml Nml Nml Nml Nml Nml Nml Nml Nml N/A  Left BicepsFemS Nml Nml Nml Nml Nml Nml Nml Nml Nml Nml Nml Nml N/A      Waveforms:

## 2018-06-18 ENCOUNTER — Telehealth: Payer: Self-pay | Admitting: Clinical

## 2018-06-18 ENCOUNTER — Encounter: Payer: Self-pay | Admitting: Obstetrics & Gynecology

## 2018-06-18 ENCOUNTER — Ambulatory Visit (INDEPENDENT_AMBULATORY_CARE_PROVIDER_SITE_OTHER): Payer: No Typology Code available for payment source | Admitting: Obstetrics & Gynecology

## 2018-06-18 DIAGNOSIS — Z124 Encounter for screening for malignant neoplasm of cervix: Secondary | ICD-10-CM | POA: Diagnosis not present

## 2018-06-18 DIAGNOSIS — Z1151 Encounter for screening for human papillomavirus (HPV): Secondary | ICD-10-CM | POA: Diagnosis not present

## 2018-06-18 DIAGNOSIS — Z23 Encounter for immunization: Secondary | ICD-10-CM | POA: Diagnosis not present

## 2018-06-18 DIAGNOSIS — Z01419 Encounter for gynecological examination (general) (routine) without abnormal findings: Secondary | ICD-10-CM

## 2018-06-18 NOTE — Addendum Note (Signed)
Addended by: Gwendlyn Deutscher A on: 06/18/2018 04:25 PM   Modules accepted: Orders

## 2018-06-18 NOTE — Progress Notes (Signed)
Subjective:    Natalie Burgess is a 63 y.o. single P2 (36 and 47 yo kids) female who presents for an annual exam. The patient has no complaints today. The patient is not currently sexually active. GYN screening history: last pap: was abnormal: But she does not know what it was, thinks it was about 2 years ago.. The patient wears seatbelts: yes. The patient participates in regular exercise: no. Has the patient ever been transfused or tattooed?: yes. The patient reports that there is not domestic violence in her life.   Menstrual History: OB History    Gravida  5   Para  2   Term  2   Preterm  0   AB  3   Living  2     SAB  2   TAB  1   Ectopic  0   Multiple  0   Live Births  2           Menarche age: 56 No LMP recorded. Patient is postmenopausal.    The following portions of the patient's history were reviewed and updated as appropriate: allergies, current medications, past family history, past medical history, past social history, past surgical history and problem list.  Review of Systems Pertinent items are noted in HPI.   No periods after age 68 Works at the [employer] on [employer] FH- ?breast cancer in her maternal aunt, no gyn cancer, ? Colon cancer in a different aunt   Objective:    BP 112/76   Pulse 91   Wt 233 lb 4.8 oz (105.8 kg)   BMI 41.00 kg/m   General Appearance:    Alert, cooperative, no distress, appears stated age  Head:    Normocephalic, without obvious abnormality, atraumatic  Eyes:    PERRL, conjunctiva/corneas clear, EOM's intact, fundi    benign, both eyes  Ears:    Normal TM's and external ear canals, both ears  Nose:   Nares normal, septum midline, mucosa normal, no drainage    or sinus tenderness  Throat:   Lips, mucosa, and tongue normal; teeth and gums normal  Neck:   Supple, symmetrical, trachea midline, no adenopathy;    thyroid:  no enlargement/tenderness/nodules; no carotid   bruit or JVD  Back:     Symmetric, no curvature, ROM  normal, no CVA tenderness  Lungs:     Clear to auscultation bilaterally, respirations unlabored  Chest Wall:    No tenderness or deformity   Heart:    Regular rate and rhythm, S1 and S2 normal, no murmur, rub   or gallop  Breast Exam:    No tenderness, masses, or nipple abnormality  Abdomen:     Soft, non-tender, bowel sounds active all four quadrants,    no masses, no organomegaly  Genitalia:    Normal female without lesion, discharge or tenderness, normal size and shape, anteverted, mobile, non-tender, normal adnexal exam      Extremities:   Extremities normal, atraumatic, no cyanosis or edema  Pulses:   2+ and symmetric all extremities  Skin:   Skin color, texture, turgor normal, no rashes or lesions  Lymph nodes:   Cervical, supraclavicular, and axillary nodes normal  Neurologic:   CNII-XII intact, normal strength, sensation and reflexes    throughout  .    Assessment:    Healthy female exam.    Plan:     Thin prep Pap smear. with cotesting TDAP and flu vaccines today Mammogram ordered

## 2018-06-18 NOTE — Telephone Encounter (Signed)
Follow up after positive PHQ9/GAD7 today: Pt is attending [facility] for treatment.   Depression screen [facility] 2/9 06/18/2018  Decreased Interest 2  Down, Depressed, Hopeless 1  PHQ - 2 Score 3  Altered sleeping 3  Tired, decreased energy 3  Change in appetite 2  Feeling bad or failure about yourself  0  Trouble concentrating 1  Moving slowly or fidgety/restless 1  Suicidal thoughts 1  PHQ-9 Score 14   GAD 7 : Generalized Anxiety Score 06/18/2018  Nervous, Anxious, on Edge 1  Control/stop worrying 1  Worry too much - different things 1  Trouble relaxing 3  Restless 2  Easily annoyed or irritable 2  Afraid - awful might happen 3  Total GAD 7 Score 13

## 2018-06-23 LAB — CYTOLOGY - PAP: HPV: NOT DETECTED

## 2018-06-24 ENCOUNTER — Telehealth: Payer: Self-pay | Admitting: *Deleted

## 2018-06-24 NOTE — Telephone Encounter (Signed)
-----   Message from Glendale Chard, DO sent at 06/20/2018  6:09 PM EST ----- Please inform patient that her nerve testing of the left leg is normal.  From a neurological stand point, there is nothing worrisome that I see on nerve testing and MRI lumbar spine to cause her pain.  Recommend doing physical therapy to see if stretching exercises can help. Thanks.

## 2018-06-24 NOTE — Telephone Encounter (Signed)
Left message giving patient results and requested for her to call me if she would like to try PT.

## 2019-01-05 IMAGING — CT CT PARANASAL SINUSES LIMITED
1 of 2 series · 7 of 10 positions shown, 9 images · non-contrast
Comparison: None.

CLINICAL DATA: Persistent cough and sinus drainage.

EXAM:
CT PARANASAL SINUS LIMITED WITHOUT CONTRAST
TECHNIQUE: Non-contiguous multidetector CT images of the paranasal sinuses were
obtained in a single plane without contrast.

[Series 4: limited sinus st · axial · 0.28mm/px · z∈[+135,+195]mm · 7 of 9 slices shown, 9 images]
[im 2/9  brain]
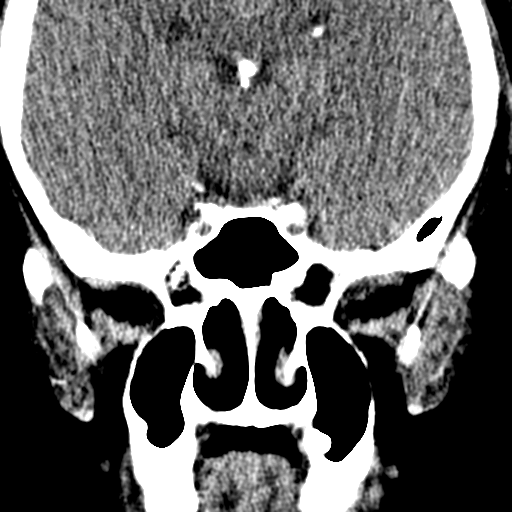
[im 2/9  bone]
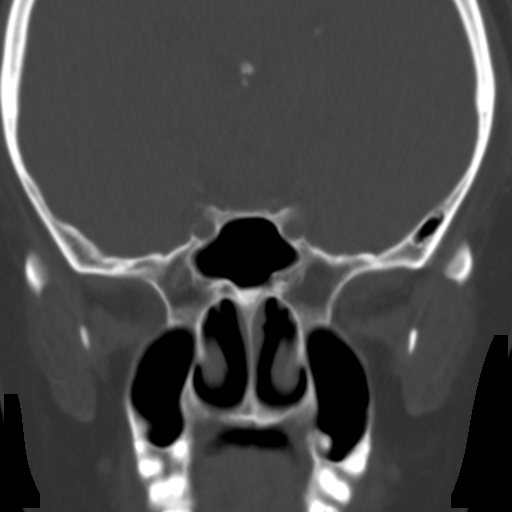
[im 3/9  bone]
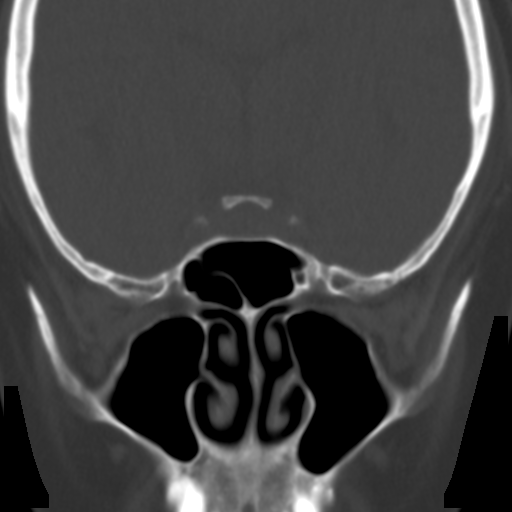
[im 4/9  bone]
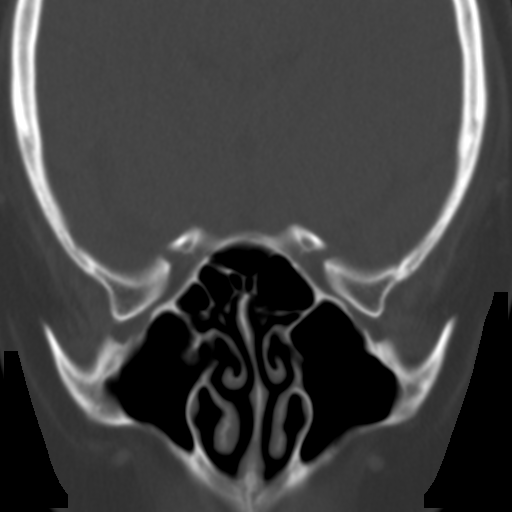
[im 5/9  bone]
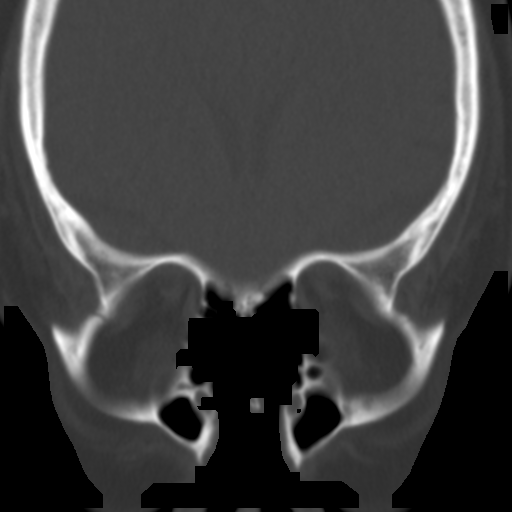
[im 6/9  brain]
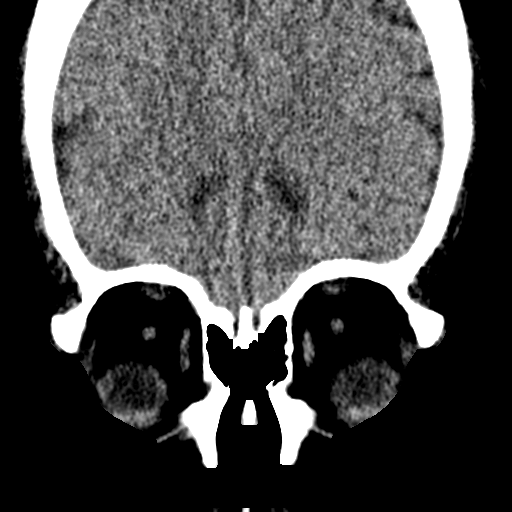
[im 6/9  bone]
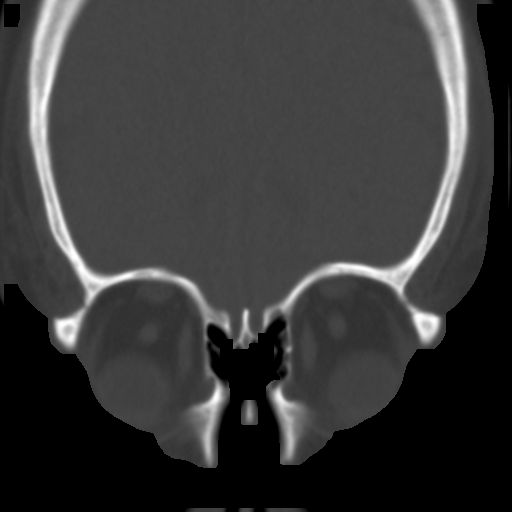
[im 7/9  bone]
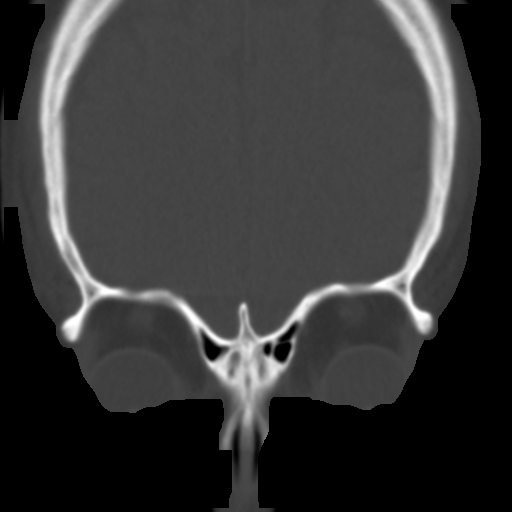
[im 8/9  bone]
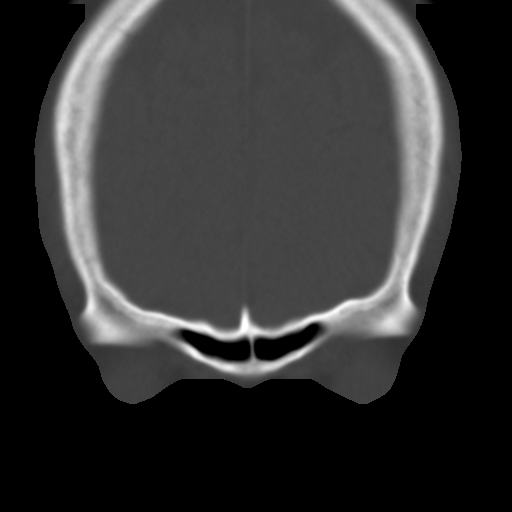

[7 of 10 positions shown; findings below may reference images not displayed]

FINDINGS: The visualized portions of the paranasal sinuses are clear without
evidence of significant mucosal thickening or fluid. Rightward nasal
septal deviation measures 4 mm. The regional soft tissues are
grossly unremarkable.
IMPRESSION: Clear sinuses.

## 2019-02-05 ENCOUNTER — Observation Stay (HOSPITAL_COMMUNITY)
Admission: EM | Admit: 2019-02-05 | Discharge: 2019-02-07 | Disposition: A | Discharge status: Home / Self Care | Payer: Federal, State, Local not specified - PPO | Attending: Internal Medicine | Admitting: Internal Medicine

## 2019-02-05 ENCOUNTER — Encounter (HOSPITAL_COMMUNITY): Payer: Self-pay

## 2019-02-05 ENCOUNTER — Emergency Department (HOSPITAL_COMMUNITY): Payer: Federal, State, Local not specified - PPO

## 2019-02-05 DIAGNOSIS — Z20828 Contact with and (suspected) exposure to other viral communicable diseases: Secondary | ICD-10-CM | POA: Diagnosis not present

## 2019-02-05 DIAGNOSIS — F418 Other specified anxiety disorders: Secondary | ICD-10-CM | POA: Diagnosis present

## 2019-02-05 DIAGNOSIS — Z79899 Other long term (current) drug therapy: Secondary | ICD-10-CM | POA: Diagnosis not present

## 2019-02-05 DIAGNOSIS — E039 Hypothyroidism, unspecified: Secondary | ICD-10-CM | POA: Diagnosis not present

## 2019-02-05 DIAGNOSIS — K529 Noninfective gastroenteritis and colitis, unspecified: Secondary | ICD-10-CM | POA: Diagnosis not present

## 2019-02-05 DIAGNOSIS — E860 Dehydration: Secondary | ICD-10-CM | POA: Insufficient documentation

## 2019-02-05 DIAGNOSIS — E876 Hypokalemia: Secondary | ICD-10-CM | POA: Insufficient documentation

## 2019-02-05 DIAGNOSIS — Z79890 Hormone replacement therapy (postmenopausal): Secondary | ICD-10-CM | POA: Insufficient documentation

## 2019-02-05 DIAGNOSIS — Z8249 Family history of ischemic heart disease and other diseases of the circulatory system: Secondary | ICD-10-CM | POA: Insufficient documentation

## 2019-02-05 DIAGNOSIS — Z87891 Personal history of nicotine dependence: Secondary | ICD-10-CM | POA: Insufficient documentation

## 2019-02-05 DIAGNOSIS — J45909 Unspecified asthma, uncomplicated: Secondary | ICD-10-CM | POA: Insufficient documentation

## 2019-02-05 DIAGNOSIS — N179 Acute kidney failure, unspecified: Secondary | ICD-10-CM | POA: Diagnosis present

## 2019-02-05 DIAGNOSIS — I214 Non-ST elevation (NSTEMI) myocardial infarction: Principal | ICD-10-CM | POA: Insufficient documentation

## 2019-02-05 DIAGNOSIS — Z7984 Long term (current) use of oral hypoglycemic drugs: Secondary | ICD-10-CM | POA: Diagnosis not present

## 2019-02-05 DIAGNOSIS — Z7951 Long term (current) use of inhaled steroids: Secondary | ICD-10-CM | POA: Diagnosis not present

## 2019-02-05 DIAGNOSIS — F4310 Post-traumatic stress disorder, unspecified: Secondary | ICD-10-CM | POA: Insufficient documentation

## 2019-02-05 DIAGNOSIS — R079 Chest pain, unspecified: Secondary | ICD-10-CM | POA: Diagnosis present

## 2019-02-05 DIAGNOSIS — E119 Type 2 diabetes mellitus without complications: Secondary | ICD-10-CM

## 2019-02-05 LAB — CBC
HCT: 36.7 % (ref 36.0–46.0)
Hemoglobin: 11.7 g/dL — ABNORMAL LOW (ref 12.0–15.0)
MCH: 25.7 pg — ABNORMAL LOW (ref 26.0–34.0)
MCHC: 31.9 g/dL (ref 30.0–36.0)
MCV: 80.5 fL (ref 80.0–100.0)
Platelets: 217 10*3/uL (ref 150–400)
RBC: 4.56 MIL/uL (ref 3.87–5.11)
RDW: 14.7 % (ref 11.5–15.5)
WBC: 8.8 10*3/uL (ref 4.0–10.5)
nRBC: 0 % (ref 0.0–0.2)

## 2019-02-05 LAB — BASIC METABOLIC PANEL
Anion gap: 12 (ref 5–15)
BUN: 36 mg/dL — ABNORMAL HIGH (ref 8–23)
CO2: 19 mmol/L — ABNORMAL LOW (ref 22–32)
Calcium: 9.1 mg/dL (ref 8.9–10.3)
Chloride: 111 mmol/L (ref 98–111)
Creatinine, Ser: 2.25 mg/dL — ABNORMAL HIGH (ref 0.44–1.00)
GFR calc Af Amer: 26 mL/min — ABNORMAL LOW (ref >60)
GFR calc non Af Amer: 23 mL/min — ABNORMAL LOW (ref >60)
Glucose, Bld: 173 mg/dL — ABNORMAL HIGH (ref 70–99)
Potassium: 3.4 mmol/L — ABNORMAL LOW (ref 3.5–5.1)
Sodium: 142 mmol/L (ref 135–145)

## 2019-02-05 LAB — TROPONIN I (HIGH SENSITIVITY): Troponin I (High Sensitivity): 351 ng/L (ref <18)

## 2019-02-05 MED ORDER — SODIUM CHLORIDE 0.9% FLUSH: 3.0000 mL | Freq: Once | INTRAVENOUS | Status: DC

## 2019-02-05 MED ORDER — SODIUM CHLORIDE 0.9 % IV BOLUS (SEPSIS)
1000.0000 mL | Freq: Once | INTRAVENOUS | Status: AC
  Administered 2019-02-06: 1000 mL via INTRAVENOUS

## 2019-02-05 NOTE — ED Triage Notes (Signed)
Pt states that she was instructed by her PCP to come and be evaluated for SOB and high troponin levels of 1.36, pt denies CP

## 2019-02-05 NOTE — ED Notes (Signed)
Dr. Dolly Rias and charge RN notified of Trop 351 and pt in [place].  Working on treatment room.

## 2019-02-05 NOTE — ED Provider Notes (Signed)
10:57 PM  Patient still in waiting room.  Received call that her troponin was elevated at 351.  It does appear that she is here for shortness of breath however I do not see an EKG done yet, will work on it.  Nurse will bring her back to her room immediately as available.   Merrily Pew, MD 02/05/19 2257

## 2019-02-05 NOTE — ED Provider Notes (Signed)
[facility] EMERGENCY DEPARTMENT Provider Note   CSN: [id number] Arrival date & time: 02/05/19  2015     History   Chief Complaint Chief Complaint  Patient presents with  . Abnormal Lab  . Shortness of Breath    HPI Natalie Burgess is a 63 y.o. female.     The history is provided by the patient.  Shortness of Breath Severity:  Moderate Onset quality:  Gradual Timing:  Intermittent Progression:  Worsening Chronicity:  Chronic Relieved by:  None tried Worsened by:  Nothing Associated symptoms: chest pain and cough   Associated symptoms: no abdominal pain, no diaphoresis, no fever, no hemoptysis and no vomiting   Risk factors: no tobacco use   Patient with history of asthma diabetes presents with shortness of breath.  She reports he has had shortness of breath for "a while "and is unable to quantify how long.  She reports she has brief mild episodes of chest pressure.  No fever/vomiting.  She has mild cough with no hemoptysis.  No pleuritic pain.  The shortness of breath is not always worsened with exertion as it happens at rest.  No history of CAD/VTE/CVA She went for a routine follow-up at the [facility] today and was told to go the ER due to an elevated troponin.  She is not had any recent severe episodes of chest pain  She reports chronic ongoing diarrhea.  Past Medical History:  Diagnosis Date  . Anemia   . Asthma   . Diabetes mellitus without complication (HCC)   . Mental disorder   . Thyroid disease     Patient Active Problem List   Diagnosis Date Noted  . Dyspnea on exertion 06/11/2017  . Morbid obesity due to excess calories (HCC) 06/11/2017  . Cough variant asthma vs uacs  06/10/2017    Past Surgical History:  Procedure Laterality Date  . CESAREAN SECTION    . CHOLECYSTECTOMY    . THYROIDECTOMY       OB History    Gravida  5   Para  2   Term  2   Preterm  0   AB  3   Living  2     SAB  2   TAB  1   Ectopic  0    Multiple  0   Live Births  2            Home Medications    Prior to Admission medications   Medication Sig Start Date End Date Taking? Authorizing Provider  albuterol (PROAIR HFA) 108 (90 Base) MCG/ACT inhaler Inhale 1-2 puffs into the lungs every 6 (six) hours as needed for wheezing or shortness of breath. 03/10/18   Georgetta Haber, NP  ALPRAZolam Prudy Feeler) 1 MG tablet Take 1 mg by mouth 2 (two) times daily as needed for anxiety.    [provider]  benzonatate (TESSALON) 100 MG capsule Take 1 capsule (100 mg total) by mouth every 8 (eight) hours. Patient not taking: Reported on 06/18/2018 03/10/18   Linus Mako B, NP  buPROPion (WELLBUTRIN XL) 300 MG 24 hr tablet Take 300 mg by mouth daily.    [provider]  busPIRone (BUSPAR) 10 MG tablet Take 10 mg by mouth 3 (three) times daily.    [provider]  chlorpheniramine (CHLOR-TRIMETON) 4 MG tablet Take 4 mg by mouth 2 (two) times daily as needed for allergies.    [provider]  cyclobenzaprine (FLEXERIL) 10 MG tablet Take  5 mg by mouth 2 (two) times daily.    [provider]  etodolac (LODINE) 400 MG tablet Take 400 mg by mouth 3 (three) times daily.    [provider]  fluticasone (FLONASE) 50 MCG/ACT nasal spray Place 2 sprays into both nostrils daily as needed for allergies or rhinitis.    [provider]  gabapentin (NEURONTIN) 300 MG capsule Take 300 mg by mouth 2 (two) times daily as needed.     [provider]  HYDROcodone-acetaminophen (NORCO/VICODIN) 5-325 MG tablet Take 1 tablet by mouth every 6 (six) hours as needed for moderate pain.    [provider]  levothyroxine (SYNTHROID, LEVOTHROID) 125 MCG tablet Take 125 mcg by mouth daily before breakfast.    [provider]  losartan (COZAAR) 25 MG tablet Take 12.5 mg by mouth 2 (two) times daily. "when I remember"     [provider]  metFORMIN (GLUCOPHAGE-XR) 750 MG 24 hr  tablet Take 750 mg by mouth daily with breakfast.    [provider]  naproxen sodium (ALEVE) 220 MG tablet Take 220 mg by mouth daily as needed.    [provider]  omeprazole (PRILOSEC) 40 MG capsule TAKE 1 CAPSULE BY MOUTH 30-60 MINS BEFORE YOUR FIRST AND LAST MEALS OF THE DAY 11/18/17   Nyoka Cowden, MD  Phenylephrine-Acetaminophen [facility] SINUS HEADACHE PO) Take by mouth as needed.    [provider]  prazosin (MINIPRESS) 2 MG capsule Take 2 mg by mouth at bedtime.    [provider]  SUMAtriptan (IMITREX) 100 MG tablet Take 100 mg by mouth every 2 (two) hours as needed for migraine. May repeat in 2 hours if headache persists or recurs.    [provider]  Vilazodone HCl (VIIBRYD) 40 MG TABS Take 40 mg by mouth daily.    [provider]    Family History Family History  Problem Relation Age of Onset  . Hypertension Mother   . Diabetes Mother   . Heart attack Father   . Other Sister        no contact, health status unknown  . HIV/AIDS Brother   . Heart attack Brother   . HIV/AIDS Daughter   . Asthma Daughter   . Hypertension Daughter   . Anxiety disorder Daughter   . Other Brother        no contact, health status unknown    Social History Social History   Tobacco Use  . Smoking status: Former Smoker    Packs/day: 0.25    Years: 13.00    Pack years: 3.25    Types: Cigarettes, Pipe    Quit date: 04/23/1990    Years since quitting: 28.8  . Smokeless tobacco: Never Used  Substance Use Topics  . Alcohol use: Yes    Comment: , beer, wine or cocktails  . Drug use: No     Allergies   Ptu [propylthiouracil]   Review of Systems Review of Systems  Constitutional: Negative for diaphoresis and fever.  Respiratory: Positive for cough and shortness of breath. Negative for hemoptysis.   Cardiovascular: Positive for chest pain and leg swelling.  Gastrointestinal: Positive for diarrhea. Negative for abdominal pain and  vomiting.  All other systems reviewed and are negative.    Physical Exam Updated Vital Signs BP 120/73   Pulse 100   Temp 98 F (36.7 C) (Oral)   Resp (!) 24   SpO2 96%   Physical Exam CONSTITUTIONAL: Well developed/well nourished HEAD:  Normocephalic/atraumatic EYES: EOMI/PERRL ENMT: Mucous membranes moist NECK: supple no meningeal signs SPINE/BACK:entire spine nontender CV: S1/S2 noted, no murmurs/rubs/gallops noted, tachycardia LUNGS: Lungs are clear to auscultation bilaterally, no apparent distress ABDOMEN: soft, nontender, no rebound or guarding, bowel sounds noted throughout abdomen GU:no cva tenderness NEURO: Pt is awake/alert/appropriate, moves all extremitiesx4.  No facial droop.   EXTREMITIES: pulses normal/equal, full ROM, no lower extremity edema or calf tenderness SKIN: warm, color normal PSYCH: no abnormalities of mood noted, alert and oriented to situation   ED Treatments / Results  Labs (all labs ordered are listed, but only abnormal results are displayed) Labs Reviewed  BASIC METABOLIC PANEL - Abnormal; Notable for the following components:      Result Value   Potassium 3.4 (*)    CO2 19 (*)    Glucose, Bld 173 (*)    BUN 36 (*)    Creatinine, Ser 2.25 (*)    GFR calc non Af Amer 23 (*)    GFR calc Af Amer 26 (*)    All other components within normal limits  CBC - Abnormal; Notable for the following components:   Hemoglobin 11.7 (*)    MCH 25.7 (*)    All other components within normal limits  TROPONIN I (HIGH SENSITIVITY) - Abnormal; Notable for the following components:   Troponin I (High Sensitivity) 351 (*)    All other components within normal limits  TROPONIN I (HIGH SENSITIVITY) - Abnormal; Notable for the following components:   Troponin I (High Sensitivity) 283 (*)    All other components within normal limits  SARS CORONAVIRUS 2 (TAT 6-24 HRS)    EKG ED ECG REPORT   Date: 02/05/2019   Rate: 114  Rhythm: sinus tachycardia  QRS  Axis: normal  Intervals: normal  ST/T Wave abnormalities: nonspecific ST changes  Conduction Disutrbances:none   I have personally reviewed the EKG tracing and agree with the computerized printout as noted.  Radiology Dg Chest 2 View  Result Date: 02/05/2019 CLINICAL DATA:  63 year old female with chest pain. EXAM: CHEST - 2 VIEW COMPARISON:  Chest radiograph dated 03/10/2018 FINDINGS: Minimal bibasilar atelectatic changes. No focal consolidation, pleural effusion, or pneumothorax. The cardiac silhouette is within normal limits. No acute osseous pathology. IMPRESSION: No active cardiopulmonary disease. Electronically Signed   By: Elgie CollardArash  Radparvar M.D.   On: 02/05/2019 21:05    Procedures .Critical Care Performed by: Zadie RhineWickline, Laveah Gloster, MD Authorized by: Zadie RhineWickline, Malea Swilling, MD   Critical care provider statement:    Critical care time (minutes):  45   Critical care start time:  02/06/2019 12:00 AM   Critical care end time:  02/06/2019 12:45 AM   Critical care was necessary to treat or prevent imminent or life-threatening deterioration of the following conditions:  Cardiac failure   Critical care was time spent personally by me on the following activities:  Ordering and review of radiographic studies, ordering and review of laboratory studies, pulse oximetry, re-evaluation of patient's condition, evaluation of patient's response to treatment, examination of patient and discussions with consultants   I assumed direction of critical care for this patient from another provider in my specialty: no       Medications Ordered in ED Medications  sodium chloride flush (NS) 0.9 % injection 3 mL (has no administration in time range)  sodium chloride 0.9 % bolus 1,000 mL (1,000 mLs Intravenous New Bag/Given 02/06/19 0010)     Initial Impression / Assessment and Plan / ED Course  I have reviewed the  triage vital signs and the nursing notes.  Pertinent labs & imaging results that were available  during my care of the patient were reviewed by me and considered in my medical decision making (see chart for details).        12:03 AM Patient presents for ongoing shortness of breath for a while.  She was at a routine follow-up and found to have elevated troponin.  Her troponin is elevated here, but also has AKI with acute dehydration.  This might be due to her recent diarrhea.  She would need to be admitted to the hospital for further evaluation.  Low suspicion for acute PE at this time is no pleuritic pain, no hemoptysis, and this has been a chronic process 12:44 AM Patient stable at this time.  She does appear mildly tachypneic.  Discussed with medicine Dr. Posey Pronto who will admit the patient.  He will endorse to the morning team that she will need to have cardiology consulted in the AM.  Patient will need to have medical evaluation prior to any cardiology intervention.  She may need echocardiogram as well.  Patient may also need a V/Q scan. Final Clinical Impressions(s) / ED Diagnoses   Final diagnoses:  Non-STEMI (non-ST elevated myocardial infarction) ([place])  AKI (acute kidney injury) [facility])  Dehydration    ED Discharge Orders    None       Ripley Fraise, MD 02/06/19 0045

## 2019-02-06 ENCOUNTER — Ambulatory Visit (HOSPITAL_COMMUNITY): Payer: Non-veteran care

## 2019-02-06 ENCOUNTER — Observation Stay (HOSPITAL_BASED_OUTPATIENT_CLINIC_OR_DEPARTMENT_OTHER): Payer: Federal, State, Local not specified - PPO

## 2019-02-06 DIAGNOSIS — I201 Angina pectoris with documented spasm: Secondary | ICD-10-CM | POA: Diagnosis not present

## 2019-02-06 DIAGNOSIS — E1169 Type 2 diabetes mellitus with other specified complication: Secondary | ICD-10-CM

## 2019-02-06 DIAGNOSIS — I249 Acute ischemic heart disease, unspecified: Secondary | ICD-10-CM

## 2019-02-06 DIAGNOSIS — R079 Chest pain, unspecified: Secondary | ICD-10-CM

## 2019-02-06 DIAGNOSIS — E119 Type 2 diabetes mellitus without complications: Secondary | ICD-10-CM

## 2019-02-06 DIAGNOSIS — F418 Other specified anxiety disorders: Secondary | ICD-10-CM | POA: Diagnosis present

## 2019-02-06 DIAGNOSIS — N179 Acute kidney failure, unspecified: Secondary | ICD-10-CM

## 2019-02-06 DIAGNOSIS — E039 Hypothyroidism, unspecified: Secondary | ICD-10-CM | POA: Diagnosis present

## 2019-02-06 DIAGNOSIS — E669 Obesity, unspecified: Secondary | ICD-10-CM

## 2019-02-06 LAB — LIPID PANEL
Cholesterol: 139 mg/dL (ref 0–200)
HDL: 31 mg/dL — ABNORMAL LOW (ref >40)
LDL Cholesterol: 84 mg/dL (ref 0–99)
Total CHOL/HDL Ratio: 4.5 RATIO
Triglycerides: 118 mg/dL (ref <150)
VLDL: 24 mg/dL (ref 0–40)

## 2019-02-06 LAB — URINALYSIS, ROUTINE W REFLEX MICROSCOPIC
Bilirubin Urine: NEGATIVE
Glucose, UA: NEGATIVE mg/dL
Hgb urine dipstick: NEGATIVE
Ketones, ur: NEGATIVE mg/dL
Nitrite: NEGATIVE
Protein, ur: NEGATIVE mg/dL
Specific Gravity, Urine: 1.018 (ref 1.005–1.030)
pH: 5 (ref 5.0–8.0)

## 2019-02-06 LAB — SARS CORONAVIRUS 2 (TAT 6-24 HRS): SARS Coronavirus 2: NEGATIVE

## 2019-02-06 LAB — NM MYOCAR MULTI W/SPECT W/WALL MOTION / EF
Estimated workload: 1 METS
MPHR: 159 {beats}/min
Peak HR: 115 {beats}/min
Percent HR: 72 %
Rest HR: 85 {beats}/min

## 2019-02-06 LAB — CBG MONITORING, ED
Glucose-Capillary: 107 mg/dL — ABNORMAL HIGH (ref 70–99)
Glucose-Capillary: 107 mg/dL — ABNORMAL HIGH (ref 70–99)

## 2019-02-06 LAB — BASIC METABOLIC PANEL
Anion gap: 9 (ref 5–15)
BUN: 21 mg/dL (ref 8–23)
CO2: 21 mmol/L — ABNORMAL LOW (ref 22–32)
Calcium: 8.8 mg/dL — ABNORMAL LOW (ref 8.9–10.3)
Chloride: 113 mmol/L — ABNORMAL HIGH (ref 98–111)
Creatinine, Ser: 1.24 mg/dL — ABNORMAL HIGH (ref 0.44–1.00)
GFR calc Af Amer: 55 mL/min — ABNORMAL LOW (ref >60)
GFR calc non Af Amer: 48 mL/min — ABNORMAL LOW (ref >60)
Glucose, Bld: 118 mg/dL — ABNORMAL HIGH (ref 70–99)
Potassium: 3.9 mmol/L (ref 3.5–5.1)
Sodium: 143 mmol/L (ref 135–145)

## 2019-02-06 LAB — HEMOGLOBIN A1C
Hgb A1c MFr Bld: 6.7 % — ABNORMAL HIGH (ref 4.8–5.6)
Mean Plasma Glucose: 145.59 mg/dL

## 2019-02-06 LAB — GLUCOSE, CAPILLARY
Glucose-Capillary: 122 mg/dL — ABNORMAL HIGH (ref 70–99)
Glucose-Capillary: 133 mg/dL — ABNORMAL HIGH (ref 70–99)

## 2019-02-06 LAB — TROPONIN I (HIGH SENSITIVITY): Troponin I (High Sensitivity): 283 ng/L (ref <18)

## 2019-02-06 LAB — CREATININE, URINE, RANDOM: Creatinine, Urine: 138.35 mg/dL

## 2019-02-06 LAB — SODIUM, URINE, RANDOM: Sodium, Ur: 144 mmol/L

## 2019-02-06 LAB — HIV ANTIBODY (ROUTINE TESTING W REFLEX): HIV Screen 4th Generation wRfx: NONREACTIVE

## 2019-02-06 LAB — TSH
TSH: 7.691 u[IU]/mL — ABNORMAL HIGH (ref 0.350–4.500)
TSH: 9.482 u[IU]/mL — ABNORMAL HIGH (ref 0.350–4.500)

## 2019-02-06 MED ORDER — POTASSIUM CHLORIDE 20 MEQ/15ML (10%) PO SOLN
40.0000 meq | Freq: Once | ORAL | Status: AC
  Administered 2019-02-06: 40 meq via ORAL
  Filled 2019-02-06: qty 30

## 2019-02-06 MED ORDER — ACETAMINOPHEN 325 MG PO TABS: 650.0000 mg | ORAL_TABLET | ORAL | Status: DC | PRN

## 2019-02-06 MED ORDER — REGADENOSON 0.4 MG/5ML IV SOLN
INTRAVENOUS | Status: AC
  Filled 2019-02-06: qty 5

## 2019-02-06 MED ORDER — LEVOTHYROXINE SODIUM 25 MCG PO TABS
125.0000 ug | ORAL_TABLET | Freq: Every day | ORAL | Status: DC
  Administered 2019-02-06 – 2019-02-07 (×2): 125 ug via ORAL
  Filled 2019-02-06 (×2): qty 1

## 2019-02-06 MED ORDER — INSULIN ASPART 100 UNIT/ML ~~LOC~~ SOLN
0.0000 [IU] | Freq: Three times a day (TID) | SUBCUTANEOUS | Status: DC
  Administered 2019-02-06 – 2019-02-07 (×2): 1 [IU] via SUBCUTANEOUS

## 2019-02-06 MED ORDER — VILAZODONE HCL 40 MG PO TABS
40.0000 mg | ORAL_TABLET | Freq: Every day | ORAL | Status: DC
  Administered 2019-02-06 – 2019-02-07 (×2): 40 mg via ORAL
  Filled 2019-02-06 (×4): qty 1

## 2019-02-06 MED ORDER — REGADENOSON 0.4 MG/5ML IV SOLN
0.4000 mg | Freq: Once | INTRAVENOUS | Status: DC
  Filled 2019-02-06: qty 5

## 2019-02-06 MED ORDER — PANTOPRAZOLE SODIUM 40 MG PO TBEC
40.0000 mg | DELAYED_RELEASE_TABLET | Freq: Every day | ORAL | Status: DC
  Administered 2019-02-06 – 2019-02-07 (×2): 40 mg via ORAL
  Filled 2019-02-06 (×2): qty 1

## 2019-02-06 MED ORDER — TECHNETIUM TC 99M TETROFOSMIN IV KIT
10.0000 | PACK | Freq: Once | INTRAVENOUS | Status: AC | PRN
  Administered 2019-02-06: 10 via INTRAVENOUS

## 2019-02-06 MED ORDER — SODIUM CHLORIDE 0.9 % IV BOLUS
1000.0000 mL | Freq: Once | INTRAVENOUS | Status: AC
  Administered 2019-02-06: 1000 mL via INTRAVENOUS

## 2019-02-06 MED ORDER — ALPRAZOLAM 0.5 MG PO TABS: 1.0000 mg | ORAL_TABLET | Freq: Two times a day (BID) | ORAL | Status: DC | PRN

## 2019-02-06 MED ORDER — ASPIRIN EC 81 MG PO TBEC
81.0000 mg | DELAYED_RELEASE_TABLET | Freq: Every day | ORAL | Status: DC
  Administered 2019-02-06 – 2019-02-07 (×2): 81 mg via ORAL
  Filled 2019-02-06 (×3): qty 1

## 2019-02-06 MED ORDER — BUSPIRONE HCL 10 MG PO TABS
10.0000 mg | ORAL_TABLET | Freq: Three times a day (TID) | ORAL | Status: DC
  Administered 2019-02-06 – 2019-02-07 (×4): 10 mg via ORAL
  Filled 2019-02-06 (×4): qty 1

## 2019-02-06 MED ORDER — TECHNETIUM TC 99M TETROFOSMIN IV KIT
30.0000 | PACK | Freq: Once | INTRAVENOUS | Status: AC | PRN
  Administered 2019-02-06: 30 via INTRAVENOUS

## 2019-02-06 MED ORDER — BUPROPION HCL ER (XL) 150 MG PO TB24
300.0000 mg | ORAL_TABLET | Freq: Every day | ORAL | Status: DC
  Administered 2019-02-06 – 2019-02-07 (×2): 300 mg via ORAL
  Filled 2019-02-06 (×2): qty 2

## 2019-02-06 MED ORDER — ONDANSETRON HCL 4 MG/2ML IJ SOLN: 4.0000 mg | Freq: Four times a day (QID) | INTRAMUSCULAR | Status: DC | PRN

## 2019-02-06 MED ORDER — SODIUM CHLORIDE 0.9 % IV SOLN
INTRAVENOUS | Status: AC
  Administered 2019-02-06: 17:00:00 via INTRAVENOUS

## 2019-02-06 MED ORDER — HEPARIN SODIUM (PORCINE) 5000 UNIT/ML IJ SOLN
5000.0000 [IU] | Freq: Three times a day (TID) | INTRAMUSCULAR | Status: DC
  Administered 2019-02-06 – 2019-02-07 (×4): 5000 [IU] via SUBCUTANEOUS
  Filled 2019-02-06 (×4): qty 1

## 2019-02-06 MED ORDER — REGADENOSON 0.4 MG/5ML IV SOLN
0.4000 mg | Freq: Once | INTRAVENOUS | Status: AC
  Administered 2019-02-06: 0.4 mg via INTRAVENOUS
  Filled 2019-02-06: qty 5

## 2019-02-06 NOTE — Progress Notes (Signed)
Tolerated test well 

## 2019-02-06 NOTE — Progress Notes (Signed)
1 day lexiscan myoview completed, pending result by [facility]

## 2019-02-06 NOTE — Consult Note (Addendum)
Cardiology Consultation:   Patient ID: Natalie Burgess MRN: [id number]; DOB: 10/23/1955  Admit date: 02/05/2019 Date of Consult: 02/06/2019  Primary Care Provider: Daisy FloroWymer, Antoinette, MD Primary Cardiologist:[place], [place]   Patient Profile:   Natalie Burgess is a 63 y.o. female with a hx of coronary vasospasm, diabetes mellitus, anxiety, PTSD, hypothyroidism and morbid obesity who is being seen today for the evaluation of chest pain/elevated troponin at the request of Dr. Allena KatzPatel.  Patient had [place] cardiac catheter[place] in [place] for chest [place].  It was felt due to coronary vasospasm.  [facility]  Felt everything stable.   History of Present Illness:   [name] reports intermittent chest pain for past few months.  Occurs intermittently "for few seconds".  Hard to take breath at that time.  No radiation.  Symptoms resolved spontaneously within few seconds.  No associated palpitation.  Reports worse episode about a month ago while she was walking.  Chest pain resolved within 5 minutes.  See also has chronic shortness of breath with cough.  Previously evaluated by Dr. Sherene SiresWert.  Suspected upper airway cough syndrome versus cough variant asthma.  Patient was riding bicycle for 30 to 45 minutes each day without chest pain and with stable shortness of breath.  She stopped a exercising without any reason.  [facility] who ordered troponin given above symptoms.  It was elevated at 1.1.  Instructed to come to ER.  Sensitivity troponin 351>>283.  Potassium 3.4.  Serum creatinine 2.25 at GFR 26.  Denies prior kidney issue.  Checks x-ray without acute cardiopulmonary disease.  Pending echocardiogram.  Covid negative.  She denies palpitation, dizziness, orthopnea or PND.  Intermittent lower extremity edema.  Her current symptoms somewhat different from prior coronary vasospasm.  Reports using last sublingual nitroglycerin  for more than 1 year ago.  Prior smoker, quit 1992.  Occasional alcohol drinking.  Denies illicit drug use.  Heart Pathway Score:     Past Medical History:  Diagnosis Date  . Anemia   . Asthma   . Diabetes mellitus without complication (HCC)   . Mental disorder   . Thyroid disease     Past Surgical History:  Procedure Laterality Date  . CESAREAN SECTION    . CHOLECYSTECTOMY    . THYROIDECTOMY      Inpatient Medications: Scheduled Meds: . aspirin EC  81 mg Oral Daily  . buPROPion  300 mg Oral Daily  . busPIRone  10 mg Oral TID  . heparin  5,000 Units Subcutaneous Q8H  . insulin aspart  0-9 Units Subcutaneous TID WC  . levothyroxine  125 mcg Oral Q0600  . pantoprazole  40 mg Oral Daily  . sodium chloride flush  3 mL Intravenous Once  . Vilazodone HCl  40 mg Oral Daily   Continuous Infusions:  PRN Meds: acetaminophen, ALPRAZolam, ondansetron (ZOFRAN) IV  Allergies:    Allergies  Allergen Reactions  . Ptu [Propylthiouracil] Other (See Comments)    Caused hepatitis    Social History:   Social History   Socioeconomic History  . Marital status: Single    Spouse name: Not on file  . Number of children: 2  . Years of education: Not on file  . Highest education level: Some college, no degree  Occupational History  . Occupation: Event organisersupervisor    Employer: [employer]  Social Needs  . Financial resource strain: Not on  file  . Food insecurity    Worry: Never true    Inability: Never true  . Transportation needs    Medical: No    Non-medical: No  Tobacco Use  . Smoking status: Former Smoker    Packs/day: 0.25    Years: 13.00    Pack years: 3.25    Types: Cigarettes, Pipe    Quit date: 04/23/1990    Years since quitting: 28.8  . Smokeless tobacco: Never Used  Substance and Sexual Activity  . Alcohol use: Yes    Comment: , beer, wine or cocktails  . Drug use: No  . Sexual activity: Not on file  Lifestyle  . Physical activity    Days per week: Not on  file    Minutes per session: Not on file  . Stress: Not on file  Relationships  . Social Musician on phone: Not on file    Gets together: Not on file    Attends religious service: Not on file    Active member of club or organization: Not on file    Attends meetings of clubs or organizations: Not on file    Relationship status: Not on file  . Intimate partner violence    Fear of current or ex partner: Not on file    Emotionally abused: Not on file    Physically abused: Not on file    Forced sexual activity: Not on file  Other Topics Concern  . Not on file  Social History Narrative   Patient is right-handed. Her granddaughter lives with her in a single level home, with a few steps to enter. She rides a stationary bike 1-2 x a week for exercise.    Family History:   Family History  Problem Relation Age of Onset  . Hypertension Mother   . Diabetes Mother   . Heart attack Father   . Other Sister        no contact, health status unknown  . HIV/AIDS Brother   . Heart attack Brother   . HIV/AIDS Daughter   . Asthma Daughter   . Hypertension Daughter   . Anxiety disorder Daughter   . Other Brother        no contact, health status unknown     ROS:  Please see the history of present illness.  All other ROS reviewed and negative.     Physical Exam/Data:   Vitals:   02/06/19 0646 02/06/19 0915 02/06/19 0945 02/06/19 1030  BP: 128/79     Pulse: 93 96 92 92  Resp: (!) 28 (!) 22 (!) 24 (!) 26  Temp:      TempSrc:      SpO2: 98% 94% 95% 96%    Intake/Output Summary (Last 24 hours) at 02/06/2019 1204 Last data filed at 02/06/2019 0424 Gross per 24 hour  Intake 1183.33 ml  Output -  Net 1183.33 ml   Last 3 Weights 06/18/2018 04/02/2018 09/23/2017  Weight (lbs) 233 lb 4.8 oz 230 lb 225 lb 9.6 oz  Weight (kg) 105.824 kg 104.327 kg 102.331 kg     There is no height or weight on file to calculate BMI.  General:  Well nourished, well developed, in no acute distress  HEENT: normal Lymph: no adenopathy Neck: no JVD Endocrine:  No thryomegaly Vascular: No carotid bruits; FA pulses 2+ bilaterally without bruits  Cardiac:  normal S1, S2; RRR; no murmur  Lungs:  clear to auscultation bilaterally, no wheezing, rhonchi or rales  Abd: soft, nontender, no hepatomegaly  Ext: no edema Musculoskeletal:  No deformities, BUE and BLE strength normal and equal Skin: warm and dry  Neuro:  CNs 2-12 intact, no focal abnormalities noted Psych:  Normal affect   EKG:  The EKG was personally reviewed and demonstrates: Sinus rhythm at rate of 97 bpm with T wave inversion anteriorly  Relevant CV Studies: Pending echocardiogram  Laboratory Data:  High Sensitivity Troponin:   Recent Labs  Lab 02/05/19 2038 02/05/19 2253  TROPONINIHS 351* 283*     Chemistry Recent Labs  Lab 02/05/19 2038  NA 142  K 3.4*  CL 111  CO2 19*  GLUCOSE 173*  BUN 36*  CREATININE 2.25*  CALCIUM 9.1  GFRNONAA 23*  GFRAA 26*  ANIONGAP 12    No results for input(s): PROT, ALBUMIN, AST, ALT, ALKPHOS, BILITOT in the last 168 hours. Hematology Recent Labs  Lab 02/05/19 2038  WBC 8.8  RBC 4.56  HGB 11.7*  HCT 36.7  MCV 80.5  MCH 25.7*  MCHC 31.9  RDW 14.7  PLT 217    Radiology/Studies:  Dg Chest 2 View  Result Date: 02/05/2019 CLINICAL DATA:  63 year old female with chest pain. EXAM: CHEST - 2 VIEW COMPARISON:  Chest radiograph dated 03/10/2018 FINDINGS: Minimal bibasilar atelectatic changes. No focal consolidation, pleural effusion, or pneumothorax. The cardiac silhouette is within normal limits. No acute osseous pathology. IMPRESSION: No active cardiopulmonary disease. Electronically Signed   By: Anner Crete M.D.   On: 02/05/2019 21:05    Assessment and Plan:   1. Chest pain/elevated troponin -Her presentation more concerning for coronary vasospasm rather than ACS.  She does have prior history of vasospasm by cardiac catheterization in 2010.  No ischemic  evaluation since then. -She had a worse episode of chest pain about a month ago which lasted for few minutes rather than few seconds.  Last episode yesterday after lab work. Sensitivity troponin 351>>283.  EKG with anterior T wave changes.  No prior EKG to compare.  Patient will need ischemic evaluation.  Not a candidate for coronary angiography or coronary CT due to elevated renal function.  Pending echocardiogram.  Keep NPO.  Will review plan with MD.  2.  AKI -Serum creatinine 2.25.  Patient denies prior kidney issue.  Avoid nephrotoxic agent. -Pending repeat BMET  3.  Hypothyroidism -TSH elevated.  Management per primary team.  4.  Diabetes mellitus -Hemoglobin A1c 6.7.  Per primary team.  For questions or updates, please contact [place] Please consult [email or web address] for contact info under     SignedLeanor Kail, PA  02/06/2019 12:04 PM   I have seen and examined the patient along with Leanor Kail, PA .  I have reviewed the chart, notes and new data.  I agree with PA/NP's note.  Key new complaints: symptoms are quite atypical, very brief paroxysms of chest discomfort at rest. No problems with recent exercise. History of cath for previous chest pain without stenoses that required PCI, presumed to have coronary spasm. Key examination changes: obesity limits the exam, but overall CV exam is normal. Key new findings / data: creat 2.2. hsTrop I 351 and apparently decreasing. ECG w anterior T wave inversion (no baseline for comparison).   PLAN: Would like to avoid contrast based procedures due to renal dysfunction (GFR around 25), unless we identify extensive ischemia. Lexiscan Myoview today. If there is a large perfusion defect, will ask Nephrology to see before cath. It would help if we knew baseline creatinine.  UA negative for proteinuria. Risk factor modification (she reports last A1c 7%), including more aggressive LDL lowering (target <70). Note low HDL.  Avoid unopposed beta blockers in case she has vasospasm (can use carvedilol).  Thurmon Fair, MD, [facility] [phone] 02/06/2019, 1:19 PM

## 2019-02-06 NOTE — Progress Notes (Signed)
Patient admitted to OBS after midnight but care began in ED before midnight.   63 yo hx coronary vasospasm, DM, obesity, hypothyroid presented with CP. Heart score 4. Workup revealed HS trops of 351>>283 and EKG with T wave changes, creatinine 2.25. Pain free this am. Evaluated by cardiology who opined an ischemic work up needed. Not candidate for coronary andiography or coronary CT due to elevated creatinine. Echo is pending. Recommend NPO for now.    A/P Chest pain with elevated troponin: Patient reporting subtle left-sided chest pressure intermittently but not currently active.  High-sensitivity troponin was 351 on arrival and downtrending to 283 on repeat.  EKG shows sinus rhythm with low voltage throughout.  Chest x-ray is unremarkable.  Patient reports a history of cardiac catheterization approximately 10 years ago in [place] and was told what sounds like having coronary vasospasm at that time. Evaluated by cards who recommend stress test.   Acute kidney injury: baseline creatinine unknown. Presumed AKI versus CKD. Will check urine studies and hold home Metformin, Excedrin.  Avoid NSAIDs.   -continue gentle IV fluids  Type 2 diabetes: A1c 6.7. on oral agents.  -hold oral agent -SSI for optimal control  Hypokalemia:mild. Repleted.   Hypothyroidism:  continue Synthroid.  Depression/Anxiety/PTSD: Continue home Wellbutrin, BuSpar, Viibryd, and as needed Xanax.   Dyanne Carrel, NP

## 2019-02-06 NOTE — H&P (Signed)
History and Physical    Zully Currier OZH:[id number] DOB: [birth year] DOA: 02/05/2019  PCP: Daisy Floro, MD  Patient coming from: Home  I have personally briefly reviewed patient's old medical records in [facility]  Chief Complaint: Chest pain  HPI: Nozomi Winsett is a 63 y.o. female with medical history significant for type 2 diabetes, hypothyroidism, depression, anxiety, PTSD, and obesity who presents to the ED for evaluation of chest pain and dyspnea.  Patient states she has been having chronic shortness of breath with cough.  She has been evaluated by pulmonology in the past who suspected upper airway cough syndrome versus cough variant asthma.  She says recently she has been experiencing mild left-sided chest pressure which has been intermittent with exertion and without radiation.  She saw her doctor at the [facility] in [place] who ordered further work-up and she was found to have an elevated troponin.  She was called with the results and advised to present to the ED for further evaluation.  Patient states that she had a cardiac cath in the past, she thinks around 2010 which was performed at the [facility] in [place].  She says she was told there was Natalie Burgess significant blockage but thinks she was told she was having vasospasm at that time.  She reports chronic loose stools ongoing for the last 2 years which is unchanged recently.  She is a former smoker of 2-3 cigarettes/day and quit in 1992.  She reports occasional alcohol use.  She denies any illicit drug use.  ED Course:  Initial vitals showed BP 116/62, pulse 118, RR 18, temp 98.0 Fahrenheit, SPO2 96% on room air.  Labs notable for sodium 142, potassium 3.4, bicarbonate 18, BUN 36, creatinine 2.25, WBC 8.8, hemoglobin 11.7, platelets 217,000, high-sensitivity troponin I 351 >> 283.  SARS-CoV-2 test was obtained and pending.  EKG showed sinus rhythm with low voltage.  2 view chest x-ray was without focal consolidation,  edema, or effusion.  Patient was given 1 L normal saline and the hospitalist service was consulted to admit for further evaluation management.  Review of Systems: All systems reviewed and are negative except as documented in history of present illness above.   Past Medical History:  Diagnosis Date  . Anemia   . Asthma   . Diabetes mellitus without complication (HCC)   . Mental disorder   . Thyroid disease     Past Surgical History:  Procedure Laterality Date  . CESAREAN SECTION    . CHOLECYSTECTOMY    . THYROIDECTOMY      Social History:  reports that she quit smoking about 28 years ago. Her smoking use included cigarettes and pipe. She has a 3.25 pack-year smoking history. She has never used smokeless tobacco. She reports current alcohol use. She reports that she does not use drugs.  Allergies  Allergen Reactions  . Ptu [Propylthiouracil] Other (See Comments)    Caused hepatitis    Family History  Problem Relation Age of Onset  . Hypertension Mother   . Diabetes Mother   . Heart attack Father   . Other Sister        Natalie Burgess contact, health status unknown  . HIV/AIDS Brother   . Heart attack Brother   . HIV/AIDS Daughter   . Asthma Daughter   . Hypertension Daughter   . Anxiety disorder Daughter   . Other Brother        Natalie Burgess contact, health status unknown     Prior to Admission  medications   Medication Sig Start Date End Date Taking? Authorizing Provider  albuterol (PROAIR HFA) 108 (90 Base) MCG/ACT inhaler Inhale 1-2 puffs into the lungs every 6 (six) hours as needed for wheezing or shortness of breath. 03/10/18  Yes Burky, Malachy Moan, NP  ALPRAZolam (XANAX) 1 MG tablet Take 1 mg by mouth 2 (two) times daily as needed for anxiety.   Yes [provider]  aspirin-acetaminophen-caffeine (EXCEDRIN MIGRAINE) [phone] MG tablet Take 1 tablet by mouth every 6 (six) hours as needed for headache.   Yes [provider]  buPROPion (WELLBUTRIN XL) 300 MG 24 hr  tablet Take 300 mg by mouth daily.   Yes [provider]  busPIRone (BUSPAR) 10 MG tablet Take 10 mg by mouth 3 (three) times daily.   Yes [provider]  gabapentin (NEURONTIN) 300 MG capsule Take 300 mg by mouth 2 (two) times daily as needed (for pain).    Yes [provider]  glipiZIDE (GLUCOTROL) 5 MG tablet Take 5 mg by mouth 2 (two) times daily. 12/29/18  Yes [provider]  levothyroxine (SYNTHROID, LEVOTHROID) 125 MCG tablet Take 125 mcg by mouth daily before breakfast.   Yes [provider]  metFORMIN (GLUCOPHAGE-XR) 750 MG 24 hr tablet Take 750 mg by mouth daily with breakfast.   Yes [provider]  omeprazole (PRILOSEC) 40 MG capsule TAKE 1 CAPSULE BY MOUTH 30-60 MINS BEFORE YOUR FIRST AND LAST MEALS OF THE DAY Patient taking differently: Take 40 mg by mouth daily.  11/18/17  Yes Tanda Rockers, MD  prazosin (MINIPRESS) 2 MG capsule Take 2 mg by mouth 3 times/day as needed-between meals & bedtime (for sleep).    Yes [provider]  SUMAtriptan (IMITREX) 100 MG tablet Take 100 mg by mouth every 2 (two) hours as needed for migraine. May repeat in 2 hours if headache persists or recurs.   Yes [provider]  Vilazodone HCl (VIIBRYD) 40 MG TABS Take 40 mg by mouth daily.   Yes [provider]  benzonatate (TESSALON) 100 MG capsule Take 1 capsule (100 mg total) by mouth every 8 (eight) hours. Patient not taking: Reported on 06/18/2018 03/10/18   Zigmund Gottron, NP    Physical Exam: Vitals:   02/05/19 2330 02/06/19 0000 02/06/19 0015 02/06/19 0100  BP: 120/73   140/79  Pulse: 100 100 99 100  Resp: (!) 24 (!) 25 (!) 31 (!) 30  Temp:      TempSrc:      SpO2: 96% 96% 97% 94%    Constitutional: Obese woman resting supine in bed, NAD, calm, comfortable Eyes: PERRL, lids and conjunctivae normal, exophthalmos ENMT: Mucous membranes are moist. Posterior pharynx clear of any exudate or lesions.Normal  dentition.  Neck: normal, supple, Natalie Burgess masses. Respiratory: clear to auscultation bilaterally, Natalie Burgess wheezing, Natalie Burgess crackles. Normal respiratory effort. Natalie Burgess accessory muscle use.  Cardiovascular: Regular rate and rhythm, Natalie Burgess murmurs / rubs / gallops. Natalie Burgess extremity edema. 2+ pedal pulses. Abdomen: Natalie Burgess tenderness, Natalie Burgess masses palpated. Natalie Burgess hepatosplenomegaly. Bowel sounds positive.  Musculoskeletal: Natalie Burgess clubbing / cyanosis. Natalie Burgess joint deformity upper and lower extremities. Good ROM, Natalie Burgess contractures. Normal muscle tone.  Skin: Natalie Burgess rashes, lesions, ulcers. Natalie Burgess induration Neurologic: CN 2-12 grossly intact. Sensation intact, Strength 5/5 in all 4.  Psychiatric: Normal judgment and insight. Alert and oriented x 3. Normal mood.     Labs on Admission: I have personally reviewed following labs and imaging studies  CBC: Recent Labs  Lab 02/05/19 2038  WBC 8.8  HGB 11.7*  HCT 36.7  MCV 80.5  PLT 217   Basic Metabolic Panel: Recent Labs  Lab 02/05/19 2038  NA 142  K 3.4*  CL 111  CO2 19*  GLUCOSE 173*  BUN 36*  CREATININE 2.25*  CALCIUM 9.1   GFR: CrCl cannot be calculated (Unknown ideal weight.). Liver Function Tests: Natalie Burgess results for input(s): AST, ALT, ALKPHOS, BILITOT, PROT, ALBUMIN in the last 168 hours. Natalie Burgess results for input(s): LIPASE, AMYLASE in the last 168 hours. Natalie Burgess results for input(s): AMMONIA in the last 168 hours. Coagulation Profile: Natalie Burgess results for input(s): INR, PROTIME in the last 168 hours. Cardiac Enzymes: Natalie Burgess results for input(s): CKTOTAL, CKMB, CKMBINDEX, TROPONINI in the last 168 hours. BNP (last 3 results) Natalie Burgess results for input(s): PROBNP in the last 8760 hours. HbA1C: Natalie Burgess results for input(s): HGBA1C in the last 72 hours. CBG: Natalie Burgess results for input(s): GLUCAP in the last 168 hours. Lipid Profile: Natalie Burgess results for input(s): CHOL, HDL, LDLCALC, TRIG, CHOLHDL, LDLDIRECT in the last 72 hours. Thyroid Function Tests: Natalie Burgess results for input(s): TSH, T4TOTAL, FREET4, T3FREE, THYROIDAB  in the last 72 hours. Anemia Panel: Natalie Burgess results for input(s): VITAMINB12, FOLATE, FERRITIN, TIBC, IRON, RETICCTPCT in the last 72 hours. Urine analysis: Natalie Burgess results found for: COLORURINE, APPEARANCEUR, LABSPEC, PHURINE, GLUCOSEU, HGBUR, BILIRUBINUR, KETONESUR, PROTEINUR, UROBILINOGEN, NITRITE, LEUKOCYTESUR  Radiological Exams on Admission: Dg Chest 2 View  Result Date: 02/05/2019 CLINICAL DATA:  63 year old female with chest pain. EXAM: CHEST - 2 VIEW COMPARISON:  Chest radiograph dated 03/10/2018 FINDINGS: Minimal bibasilar atelectatic changes. Natalie Burgess focal consolidation, pleural effusion, or pneumothorax. The cardiac silhouette is within normal limits. Natalie Burgess acute osseous pathology. IMPRESSION: Natalie Burgess active cardiopulmonary disease. Electronically Signed   By: Elgie CollardArash  Radparvar M.D.   On: 02/05/2019 21:05    EKG: Independently reviewed.  Sinus rhythm with low voltage, Natalie Burgess prior for comparison.  Assessment/Plan Principal Problem:   Chest pain Active Problems:   AKI (acute kidney injury) (HCC)   Type 2 diabetes mellitus (HCC)   Hypothyroidism   Depression with anxiety  Deola Walpole is a 63 y.o. female with medical history significant for type 2 diabetes, hypothyroidism, depression, anxiety, PTSD, and obesity who is admitted for chest pain with elevated troponin.   Chest pain with elevated troponin: Patient reporting subtle left-sided chest pressure intermittently but not currently active.  High-sensitivity troponin was 351 on arrival and downtrending to 283 on repeat.  EKG shows sinus rhythm with low voltage throughout.  Chest x-ray is unremarkable.  Patient reports a history of cardiac catheterization approximately 10 years ago in [place] and was told what sounds like having coronary vasospasm at that time. -Monitor on telemetry -Echocardiogram -Start aspirin 81 mg daily -Check lipid panel  Acute kidney injury: Presumed AKI versus CKD.  Natalie Burgess prior labs available.  Will check urine studies and  hold home Metformin, Excedrin.  Avoid NSAIDs.  Will give additional fluid and repeat labs in a.m.  Type 2 diabetes: On Metformin and glipizide as an outpatient.  Check A1c and use sensitive SSI while in hospital.  Hypokalemia: Replete orally and repeat labs.  Hypothyroidism: Check TSH and continue Synthroid.  Depression/Anxiety/PTSD: Continue home Wellbutrin, BuSpar, Viibryd, and as needed Xanax.  DVT prophylaxis: Subcutaneous heparin Code Status: Full code, confirmed with patient Family Communication: Discussed with patient, she has updated family Disposition Plan: Likely discharge to home pending clinical progress Consults called: None Admission status: Observation   Darreld McleanVishal Geronimo Diliberto MD [facility]  If 7PM-7AM, please contact  night-coverage [email or web address]  02/06/2019, 1:19 AM

## 2019-02-07 ENCOUNTER — Observation Stay (HOSPITAL_COMMUNITY): Payer: Federal, State, Local not specified - PPO

## 2019-02-07 ENCOUNTER — Other Ambulatory Visit: Payer: Self-pay

## 2019-02-07 DIAGNOSIS — R079 Chest pain, unspecified: Secondary | ICD-10-CM | POA: Diagnosis not present

## 2019-02-07 DIAGNOSIS — E1169 Type 2 diabetes mellitus with other specified complication: Secondary | ICD-10-CM | POA: Diagnosis not present

## 2019-02-07 DIAGNOSIS — N179 Acute kidney failure, unspecified: Secondary | ICD-10-CM | POA: Diagnosis not present

## 2019-02-07 DIAGNOSIS — E039 Hypothyroidism, unspecified: Secondary | ICD-10-CM

## 2019-02-07 LAB — BASIC METABOLIC PANEL
Anion gap: 9 (ref 5–15)
BUN: 13 mg/dL (ref 8–23)
CO2: 20 mmol/L — ABNORMAL LOW (ref 22–32)
Calcium: 8.7 mg/dL — ABNORMAL LOW (ref 8.9–10.3)
Chloride: 110 mmol/L (ref 98–111)
Creatinine, Ser: 1.02 mg/dL — ABNORMAL HIGH (ref 0.44–1.00)
GFR calc Af Amer: >60 mL/min (ref >60)
GFR calc non Af Amer: 59 mL/min — ABNORMAL LOW (ref >60)
Glucose, Bld: 130 mg/dL — ABNORMAL HIGH (ref 70–99)
Potassium: 3.5 mmol/L (ref 3.5–5.1)
Sodium: 139 mmol/L (ref 135–145)

## 2019-02-07 LAB — CBC
HCT: 33 % — ABNORMAL LOW (ref 36.0–46.0)
Hemoglobin: 10.8 g/dL — ABNORMAL LOW (ref 12.0–15.0)
MCH: 25.8 pg — ABNORMAL LOW (ref 26.0–34.0)
MCHC: 32.7 g/dL (ref 30.0–36.0)
MCV: 78.9 fL — ABNORMAL LOW (ref 80.0–100.0)
Platelets: 165 10*3/uL (ref 150–400)
RBC: 4.18 MIL/uL (ref 3.87–5.11)
RDW: 14.5 % (ref 11.5–15.5)
WBC: 5.6 10*3/uL (ref 4.0–10.5)
nRBC: 0 % (ref 0.0–0.2)

## 2019-02-07 LAB — GLUCOSE, CAPILLARY: Glucose-Capillary: 127 mg/dL — ABNORMAL HIGH (ref 70–99)

## 2019-02-07 MED ORDER — LEVOTHYROXINE SODIUM 75 MCG PO TABS: 137.5000 ug | ORAL_TABLET | Freq: Every day | ORAL | Status: DC

## 2019-02-07 MED ORDER — LEVOTHYROXINE SODIUM 137 MCG PO TABS: 137.0000 ug | ORAL_TABLET | Freq: Every day | ORAL | 0 refills | Status: AC

## 2019-02-07 NOTE — Progress Notes (Addendum)
Progress Note  Patient Name: Natalie Burgess Date of Encounter: 02/07/2019  Primary Cardiologist: No primary care provider on file. [facility]  Subjective   Feeling much better.  No further chest pain.  Inpatient Medications    Scheduled Meds: . aspirin EC  81 mg Oral Daily  . buPROPion  300 mg Oral Daily  . busPIRone  10 mg Oral TID  . heparin  5,000 Units Subcutaneous Q8H  . insulin aspart  0-9 Units Subcutaneous TID WC  . [START ON 02/08/2019] levothyroxine  137.5 mcg Oral Q0600  . pantoprazole  40 mg Oral Daily  . regadenoson  0.4 mg Intravenous Once  . sodium chloride flush  3 mL Intravenous Once  . Vilazodone HCl  40 mg Oral Daily   Continuous Infusions:  PRN Meds: acetaminophen, ALPRAZolam, ondansetron (ZOFRAN) IV   Vital Signs    Vitals:   02/07/19 0008 02/07/19 0356 02/07/19 0358 02/07/19 0738  BP: 133/81 (!) 144/81  (!) 133/91  Pulse: 84 70  91  Resp: 20 20  18   Temp: 98.3 F (36.8 C) 98.4 F (36.9 C)  98.9 F (37.2 C)  TempSrc: Oral Oral  Oral  SpO2: 95% 94%  95%  Weight:   99.3 kg   Height:        Intake/Output Summary (Last 24 hours) at 02/07/2019 02/09/2019 Last data filed at 02/07/2019 0603 Gross per 24 hour  Intake 303.14 ml  Output 750 ml  Net -446.86 ml   Last 3 Weights 02/07/2019 02/06/2019 06/18/2018  Weight (lbs) 218 lb 14.4 oz 219 lb 12.8 oz 233 lb 4.8 oz  Weight (kg) 99.292 kg 99.7 kg 105.824 kg      Telemetry    Normal sinus rhythm- Personally Reviewed  ECG    No new tracing- Personally Reviewed  Physical Exam  Morbidly obese GEN: No acute distress.   Neck: No JVD Cardiac: RRR, no murmurs, rubs, or gallops.  Respiratory: Clear to auscultation bilaterally. GI: Soft, nontender, non-distended  MS: No edema; No deformity. Neuro:  Nonfocal  Psych: Normal affect   Labs    High Sensitivity Troponin:   Recent Labs  Lab 02/05/19 2038 02/05/19 2253  TROPONINIHS 351* 283*      Chemistry Recent Labs  Lab 02/05/19  2038 02/06/19 0928 02/07/19 0511  NA 142 143 139  K 3.4* 3.9 3.5  CL 111 113* 110  CO2 19* 21* 20*  GLUCOSE 173* 118* 130*  BUN 36* 21 13  CREATININE 2.25* 1.24* 1.02*  CALCIUM 9.1 8.8* 8.7*  GFRNONAA 23* 48* 59*  GFRAA 26* 55* >60  ANIONGAP 12 9 9      Hematology Recent Labs  Lab 02/05/19 2038 02/07/19 0511  WBC 8.8 5.6  RBC 4.56 4.18  HGB 11.7* 10.8*  HCT 36.7 33.0*  MCV 80.5 78.9*  MCH 25.7* 25.8*  MCHC 31.9 32.7  RDW 14.7 14.5  PLT 217 165    BNPNo results for input(s): BNP, PROBNP in the last 168 hours.   DDimer No results for input(s): DDIMER in the last 168 hours.   Radiology    Dg Chest 2 View  Result Date: 02/05/2019 CLINICAL DATA:  63 year old female with chest pain. EXAM: CHEST - 2 VIEW COMPARISON:  Chest radiograph dated 03/10/2018 FINDINGS: Minimal bibasilar atelectatic changes. No focal consolidation, pleural effusion, or pneumothorax. The cardiac silhouette is within normal limits. No acute osseous pathology. IMPRESSION: No active cardiopulmonary disease. Electronically Signed   By: 77 M.D.   On: 02/05/2019  21:05   Nm Myocar Multi W/spect W/wall Motion / Ef  Result Date: 02/06/2019 CLINICAL DATA:  Acute coronary syndrome.  Elevated troponin. EXAM: MYOCARDIAL IMAGING WITH SPECT (REST AND PHARMACOLOGIC-STRESS) GATED LEFT VENTRICULAR WALL MOTION STUDY LEFT VENTRICULAR EJECTION FRACTION TECHNIQUE: Standard myocardial SPECT imaging was performed after resting intravenous injection of 10 mCi Tc-52m tetrofosmin. Subsequently, intravenous infusion of Lexiscan was performed under the supervision of the Cardiology staff. At peak effect of the drug, 30 mCi Tc-46m tetrofosmin was injected intravenously and standard myocardial SPECT imaging was performed. Quantitative gated imaging was also performed to evaluate left ventricular wall motion, and estimate left ventricular ejection fraction. COMPARISON:  None. FINDINGS: Perfusion: No decreased activity in  the left ventricle on stress imaging to suggest reversible ischemia or infarction. Wall Motion: Normal left ventricular wall motion. No left ventricular dilation. Left Ventricular Ejection Fraction: 61 % End diastolic volume 72 ml End systolic volume 28 ml IMPRESSION: 1. No reversible ischemia or infarction. 2. Normal left ventricular wall motion. 3. Left ventricular ejection fraction 61% 4. Non invasive risk stratification*: Low *2012 Appropriate Use Criteria for Coronary Revascularization Focused Update: J Am Coll Cardiol. 2706;23(7):628-315. http://content.[email or web address]?articleid=[email or web address] Electronically Signed   By: Dorise Bullion III M.D   On: 02/06/2019 17:46    Cardiac Studies   Nuclear study reported above  Patient Profile     63 y.o. female with morbid obesity, type 2 diabetes mellitus on insulin, history of coronary vasospasm, admitted with atypical chest pain and abnormal cardiac troponin, acute renal insufficiency related to diarrhea.  Assessment & Plan    Normal nuclear stress test.  Elevated high-sensitivity troponin level nonspecific, likely related to acute illness.  No indication for additional cardiac work-up at this time. Renal parameters have normalized with rehydration. TSH is mildly elevated, could represent sick euthyroid syndrome but may also need thyroid dose of levothyroxine. Microcytic anemia suggests iron deficiency.  She reports frequent bleeding from hemorrhoids. HDL cholesterol level is very low, while LDL cholesterol level is acceptable.  Recommend weight loss and exercise to improve HDL and insulin sensitivity. [facility] will sign off.   Medication Recommendations: No change in cardiac meds Other recommendations (labs, testing, etc): Weight loss, exercise. Follow up as an outpatient:  [place]  For questions or updates, please contact [place] Please consult [email or web address] for contact info under        Signed, Sanda Klein, MD   02/07/2019, 8:22 AM

## 2019-02-07 NOTE — Discharge Summary (Signed)
Physician Discharge Summary  Idaly Zwahlen VOZ:[id number] DOB: 04-Feb-1956 DOA: 02/05/2019  PCP: Rodney Langton, MD  Admit date: 02/05/2019 Discharge date: 02/07/2019  Time spent: 35 minutes  Recommendations for Outpatient Follow-up:  1. PCP in 1 week, please repeat TSH in 6 to 8 weeks 2. Check CBC and anemia panel at follow-up   Discharge Diagnoses:  Principal Problem:   Chest pain Active Problems:   Morbid obesity due to excess calories (HCC)   AKI (acute kidney injury) ([place])   Type 2 diabetes mellitus ([place])   Hypothyroidism   Depression with anxiety   Discharge Condition: Stable  Diet recommendation: Diabetic, heart healthy  Filed Weights   02/06/19 1253 02/07/19 0358  Weight: 99.7 kg 99.3 kg    History of present illness:  63 year old female with history of type 2 diabetes, hypothyroidism, coronary vasospasm presented to the emergency room with atypical chest pain with some typical features, noted to have elevated high-sensitivity troponin  Hospital Course:   1. Atypical Chest pain, elevated troponin -High-sensitivity troponin trended up to 351 from 283, EKG was nonspecific -Cardiology consulted she has numerous risk factors for CAD -underwent  Myoview yesterday evening which was negative, low risk, no inducible ischemia -discharged home in a stable condition  2.  Acute kidney injury, creatinine 2.2 on admission -Baseline unknown but per patient report her labs at the [place] have always noted normal kidney function -improved with hydration, down to 1.12  3. DM type 2 -Hemoglobin A1c is 6.7, resumed metformin  4.  Hypothyroidism -TSH is high at 9.4, she is already on a high replacement dose of Synthroid at 125 mcg, reports good compliance with this, dose increased to 135 mcg at discharge advised to recheck TSH in 6 to 8 weeks   5. Obesity -Needs lifestyle modification   Consultations: Cardiology  Discharge Exam: Vitals:   02/07/19 0356 02/07/19 0738  BP:  (!) 144/81 (!) 133/91  Pulse: 70 91  Resp: 20 18  Temp: 98.4 F (36.9 C) 98.9 F (37.2 C)  SpO2: 94% 95%    General: AAOx3 Cardiovascular: S1S2/RRR Respiratory: CTAB  Discharge Instructions   Discharge Instructions    Diet - low sodium heart healthy   Complete by: As directed    Diet Carb Modified   Complete by: As directed    Increase activity slowly   Complete by: As directed      Allergies as of 02/07/2019      Reactions   Ptu [propylthiouracil] Other (See Comments)   Caused hepatitis      Medication List    STOP taking these medications   benzonatate 100 MG capsule Commonly known as: TESSALON     TAKE these medications   albuterol 108 (90 Base) MCG/ACT inhaler Commonly known as: ProAir HFA Inhale 1-2 puffs into the lungs every 6 (six) hours as needed for wheezing or shortness of breath.   ALPRAZolam 1 MG tablet Commonly known as: XANAX Take 1 mg by mouth 2 (two) times daily as needed for anxiety.   aspirin-acetaminophen-caffeine 250-250-65 MG tablet Commonly known as: EXCEDRIN MIGRAINE Take 1 tablet by mouth every 6 (six) hours as needed for headache.   buPROPion 300 MG 24 hr tablet Commonly known as: WELLBUTRIN XL Take 300 mg by mouth daily.   busPIRone 10 MG tablet Commonly known as: BUSPAR Take 10 mg by mouth 3 (three) times daily.   gabapentin 300 MG capsule Commonly known as: NEURONTIN Take 300 mg by mouth 2 (two) times daily as needed (for  pain).   glipiZIDE 5 MG tablet Commonly known as: GLUCOTROL Take 5 mg by mouth 2 (two) times daily.   levothyroxine 137 MCG tablet Commonly known as: SYNTHROID Take 1 tablet (137 mcg total) by mouth daily at 6 (six) AM. Start taking on: February 08, 2019 What changed:   medication strength  how much to take  when to take this   metFORMIN 750 MG 24 hr tablet Commonly known as: GLUCOPHAGE-XR Take 750 mg by mouth daily with breakfast.   omeprazole 40 MG capsule Commonly known as:  PRILOSEC TAKE 1 CAPSULE BY MOUTH 30-60 MINS BEFORE YOUR FIRST AND LAST MEALS OF THE DAY What changed: See the new instructions.   prazosin 2 MG capsule Commonly known as: MINIPRESS Take 2 mg by mouth 3 times/day as needed-between meals & bedtime (for sleep).   SUMAtriptan 100 MG tablet Commonly known as: IMITREX Take 100 mg by mouth every 2 (two) hours as needed for migraine. May repeat in 2 hours if headache persists or recurs.   Vilazodone HCl 40 MG Tabs Commonly known as: VIIBRYD Take 40 mg by mouth daily.      Allergies  Allergen Reactions  . Ptu [Propylthiouracil] Other (See Comments)    Caused hepatitis   Follow-up Information    Wymer, Sherry RuffingAntoinette, MD. Schedule an appointment as soon as possible for a visit in 1 week(s).   Specialty: Internal Medicine Contact information: [address] [place] [id number] [phone]            The results of significant diagnostics from this hospitalization (including imaging, microbiology, ancillary and laboratory) are listed below for reference.    Significant Diagnostic Studies: Dg Chest 2 View  Result Date: 02/05/2019 CLINICAL DATA:  63 year old female with chest pain. EXAM: CHEST - 2 VIEW COMPARISON:  Chest radiograph dated 03/10/2018 FINDINGS: Minimal bibasilar atelectatic changes. No focal consolidation, pleural effusion, or pneumothorax. The cardiac silhouette is within normal limits. No acute osseous pathology. IMPRESSION: No active cardiopulmonary disease. Electronically Signed   By: Elgie CollardArash  Radparvar M.D.   On: 02/05/2019 21:05   Nm Myocar Multi W/spect W/wall Motion / Ef  Result Date: 02/06/2019 CLINICAL DATA:  Acute coronary syndrome.  Elevated troponin. EXAM: MYOCARDIAL IMAGING WITH SPECT (REST AND PHARMACOLOGIC-STRESS) GATED LEFT VENTRICULAR WALL MOTION STUDY LEFT VENTRICULAR EJECTION FRACTION TECHNIQUE: Standard myocardial SPECT imaging was performed after resting intravenous injection of 10 mCi  Tc-7760m tetrofosmin. Subsequently, intravenous infusion of Lexiscan was performed under the supervision of the Cardiology staff. At peak effect of the drug, 30 mCi Tc-8360m tetrofosmin was injected intravenously and standard myocardial SPECT imaging was performed. Quantitative gated imaging was also performed to evaluate left ventricular wall motion, and estimate left ventricular ejection fraction. COMPARISON:  None. FINDINGS: Perfusion: No decreased activity in the left ventricle on stress imaging to suggest reversible ischemia or infarction. Wall Motion: Normal left ventricular wall motion. No left ventricular dilation. Left Ventricular Ejection Fraction: 61 % End diastolic volume 72 ml End systolic volume 28 ml IMPRESSION: 1. No reversible ischemia or infarction. 2. Normal left ventricular wall motion. 3. Left ventricular ejection fraction 61% 4. Non invasive risk stratification*: Low *2012 Appropriate Use Criteria for Coronary Revascularization Focused Update: J Am Coll Cardiol. 2012;59(9):857-881. http://content.dementiazones.comonlinejacc.org/article.aspx?articleid=1201161 Electronically Signed   By: Gerome Samavid  Williams III M.D   On: 02/06/2019 17:46    Microbiology: Recent Results (from the past 240 hour(s))  SARS CORONAVIRUS 2 (TAT 6-24 HRS) Nasopharyngeal Nasopharyngeal Swab     Status: None   Collection  Time: 02/05/19 11:59 PM   Specimen: Nasopharyngeal Swab  Result Value Ref Range Status   SARS Coronavirus 2 NEGATIVE NEGATIVE Final    Comment: (NOTE) SARS-CoV-2 target nucleic acids are NOT DETECTED. The SARS-CoV-2 RNA is generally detectable in upper and lower respiratory specimens during the acute phase of infection. Negative results do not preclude SARS-CoV-2 infection, do not rule out co-infections with other pathogens, and should not be used as the sole basis for treatment or other patient management decisions. Negative results must be combined with clinical observations, patient history, and  epidemiological information. The expected result is Negative. Fact Sheet for Patients: HairSlick.no Fact Sheet for Healthcare Providers: quierodirigir.com This test is not yet approved or cleared by the [place] FDA and  has been authorized for detection and/or diagnosis of SARS-CoV-2 by FDA under an Emergency Use Authorization (EUA). This EUA will remain  in effect (meaning this test can be used) for the duration of the COVID-19 declaration under Section 56 4(b)(1) of the Act, 21 U.S.C. section 360bbb-3(b)(1), unless the authorization is terminated or revoked sooner. Performed at [facility], [address] [place], [place] [address]      Labs: Basic Metabolic Panel: Recent Labs  Lab 02/05/19 2038 02/06/19 0928 02/07/19 0511  NA 142 143 139  K 3.4* 3.9 3.5  CL 111 113* 110  CO2 19* 21* 20*  GLUCOSE 173* 118* 130*  BUN 36* 21 13  CREATININE 2.25* 1.24* 1.02*  CALCIUM 9.1 8.8* 8.7*   Liver Function Tests: No results for input(s): AST, ALT, ALKPHOS, BILITOT, PROT, ALBUMIN in the last 168 hours. No results for input(s): LIPASE, AMYLASE in the last 168 hours. No results for input(s): AMMONIA in the last 168 hours. CBC: Recent Labs  Lab 02/05/19 2038 02/07/19 0511  WBC 8.8 5.6  HGB 11.7* 10.8*  HCT 36.7 33.0*  MCV 80.5 78.9*  PLT 217 165   Cardiac Enzymes: No results for input(s): CKTOTAL, CKMB, CKMBINDEX, TROPONINI in the last 168 hours. BNP: BNP (last 3 results) No results for input(s): BNP in the last 8760 hours.  ProBNP (last 3 results) No results for input(s): PROBNP in the last 8760 hours.  CBG: Recent Labs  Lab 02/06/19 0810 02/06/19 1231 02/06/19 1725 02/06/19 2205 02/07/19 0648  GLUCAP 107* 107* 122* 133* 127*       Signed:  Zannie Cove MD.  [facility] 02/07/2019, 2:21 PM

## 2019-02-07 NOTE — Plan of Care (Signed)
  Problem: Education: Goal: Knowledge of General Education information will improve Description: Including pain rating scale, medication(s)/side effects and non-pharmacologic comfort measures Outcome: Progressing   Problem: Health Behavior/Discharge Planning: Goal: Ability to manage health-related needs will improve Outcome: Progressing   Problem: Pain Managment: Goal: General experience of comfort will improve Outcome: Progressing   

## 2020-09-07 ENCOUNTER — Encounter (HOSPITAL_COMMUNITY): Payer: Self-pay

## 2020-09-07 ENCOUNTER — Other Ambulatory Visit: Payer: Self-pay

## 2020-09-07 ENCOUNTER — Ambulatory Visit (HOSPITAL_COMMUNITY)
Admission: EM | Admit: 2020-09-07 | Discharge: 2020-09-07 | Disposition: A | Discharge status: Home / Self Care | Payer: No Typology Code available for payment source | Attending: Medical Oncology | Admitting: Medical Oncology

## 2020-09-07 DIAGNOSIS — N3001 Acute cystitis with hematuria: Secondary | ICD-10-CM | POA: Insufficient documentation

## 2020-09-07 DIAGNOSIS — B9689 Other specified bacterial agents as the cause of diseases classified elsewhere: Secondary | ICD-10-CM | POA: Diagnosis not present

## 2020-09-07 LAB — POCT URINALYSIS DIPSTICK, ED / UC
Bilirubin Urine: NEGATIVE
Glucose, UA: NEGATIVE mg/dL
Ketones, ur: NEGATIVE mg/dL
Nitrite: POSITIVE — AB
Protein, ur: NEGATIVE mg/dL
Specific Gravity, Urine: 1.02 (ref 1.005–1.030)
Urobilinogen, UA: 0.2 mg/dL (ref 0.0–1.0)
pH: 6 (ref 5.0–8.0)

## 2020-09-07 MED ORDER — SULFAMETHOXAZOLE-TRIMETHOPRIM 800-160 MG PO TABS: 1.0000 | ORAL_TABLET | Freq: Two times a day (BID) | ORAL | 0 refills | Status: AC

## 2020-09-07 NOTE — ED Provider Notes (Signed)
[facility]    CSN: [id number] Arrival date & time: 09/07/20  1050      History   Chief Complaint Chief Complaint  Patient presents with  . UTI    HPI Natalie Burgess is a 65 y.o. female.   HPI  UTI Symptoms: Pt reports for dysuria and urinary frequency for the past few weeks. Slowly worsening. She has tried nothing yet but hydration with water for symptoms without relief. No vomiting, severe abdominal pain or fevers. No history of recurrent or frequent UTIs.    Past Medical History:  Diagnosis Date  . Anemia   . Asthma   . Diabetes mellitus without complication (HCC)   . Mental disorder   . Thyroid disease     Patient Active Problem List   Diagnosis Date Noted  . Chest pain 02/06/2019  . AKI (acute kidney injury) (HCC) 02/06/2019  . Type 2 diabetes mellitus (HCC) 02/06/2019  . Hypothyroidism 02/06/2019  . Depression with anxiety 02/06/2019  . Dyspnea on exertion 06/11/2017  . Morbid obesity due to excess calories (HCC) 06/11/2017  . Cough variant asthma vs uacs  06/10/2017    Past Surgical History:  Procedure Laterality Date  . CESAREAN SECTION    . CHOLECYSTECTOMY    . THYROIDECTOMY      OB History    Gravida  5   Para  2   Term  2   Preterm  0   AB  3   Living  2     SAB  2   IAB  1   Ectopic  0   Multiple  0   Live Births  2            Home Medications    Prior to Admission medications   Medication Sig Start Date End Date Taking? Authorizing Provider  albuterol (PROAIR HFA) 108 (90 Base) MCG/ACT inhaler Inhale 1-2 puffs into the lungs every 6 (six) hours as needed for wheezing or shortness of breath. 03/10/18   Georgetta Haber, NP  ALPRAZolam Prudy Feeler) 1 MG tablet Take 1 mg by mouth 2 (two) times daily as needed for anxiety.    [provider]  aspirin-acetaminophen-caffeine (EXCEDRIN MIGRAINE) [phone] MG tablet Take 1 tablet by mouth every 6 (six) hours as needed for headache.    [provider]   buPROPion (WELLBUTRIN XL) 300 MG 24 hr tablet Take 300 mg by mouth daily.    [provider]  busPIRone (BUSPAR) 10 MG tablet Take 10 mg by mouth 3 (three) times daily.    [provider]  gabapentin (NEURONTIN) 300 MG capsule Take 300 mg by mouth 2 (two) times daily as needed (for pain).     [provider]  glipiZIDE (GLUCOTROL) 5 MG tablet Take 5 mg by mouth 2 (two) times daily. 12/29/18   [provider]  levothyroxine (SYNTHROID) 137 MCG tablet Take 1 tablet (137 mcg total) by mouth daily at 6 (six) AM. 02/08/19   Zannie Cove, MD  metFORMIN (GLUCOPHAGE-XR) 750 MG 24 hr tablet Take 750 mg by mouth daily with breakfast.    [provider]  omeprazole (PRILOSEC) 40 MG capsule TAKE 1 CAPSULE BY MOUTH 30-60 MINS BEFORE YOUR FIRST AND LAST MEALS OF THE DAY Patient taking differently: Take 40 mg by mouth daily.  11/18/17   Nyoka Cowden, MD  prazosin (MINIPRESS) 2 MG capsule Take 2 mg by mouth 3 times/day as needed-between meals & bedtime (for sleep).  [provider]  SUMAtriptan (IMITREX) 100 MG tablet Take 100 mg by mouth every 2 (two) hours as needed for migraine. May repeat in 2 hours if headache persists or recurs.    [provider]  Vilazodone HCl (VIIBRYD) 40 MG TABS Take 40 mg by mouth daily.    [provider]    Family History Family History  Problem Relation Age of Onset  . Hypertension Mother   . Diabetes Mother   . Heart attack Father   . Other Sister        no contact, health status unknown  . HIV/AIDS Brother   . Heart attack Brother   . HIV/AIDS Daughter   . Asthma Daughter   . Hypertension Daughter   . Anxiety disorder Daughter   . Other Brother        no contact, health status unknown    Social History Social History   Tobacco Use  . Smoking status: Former Smoker    Packs/day: 0.25    Years: 13.00    Pack years: 3.25    Types: Cigarettes, Pipe    Quit date: 04/23/1990    Years  since quitting: 30.3  . Smokeless tobacco: Never Used  Vaping Use  . Vaping Use: Never used  Substance Use Topics  . Alcohol use: Yes    Comment: , beer, wine or cocktails  . Drug use: No     Allergies   Ptu [propylthiouracil]   Review of Systems Review of Systems  As stated above in HPI Physical Exam Triage Vital Signs ED Triage Vitals  Enc Vitals Group     BP 09/07/20 1244 (!) 149/84     Pulse Rate 09/07/20 1244 64     Resp 09/07/20 1244 17     Temp 09/07/20 1244 98.2 F (36.8 C)     Temp Source 09/07/20 1244 Oral     SpO2 09/07/20 1244 97 %     Weight --      Height --      Head Circumference --      Peak Flow --      Pain Score 09/07/20 1243 5     Pain Loc --      Pain Edu? --      Excl. in GC? --    No data found.  Updated Vital Signs BP (!) 149/84 (BP Location: Right Arm)   Pulse 64   Temp 98.2 F (36.8 C) (Oral)   Resp 17   SpO2 97%   Physical Exam Vitals and nursing note reviewed.  Constitutional:      General: She is not in acute distress.    Appearance: Normal appearance. She is not ill-appearing, toxic-appearing or diaphoretic.  HENT:     Head: Normocephalic and atraumatic.  Cardiovascular:     Rate and Rhythm: Normal rate and regular rhythm.     Heart sounds: Normal heart sounds.  Pulmonary:     Effort: Pulmonary effort is normal.     Breath sounds: Normal breath sounds.  Abdominal:     General: Bowel sounds are normal. There is no distension.     Palpations: Abdomen is soft. There is no mass.     Tenderness: There is no abdominal tenderness. There is no right CVA tenderness, left CVA tenderness, guarding or rebound.     Hernia: No hernia is present.  Lymphadenopathy:     Cervical: No cervical adenopathy.  Skin:    General: Skin is warm.  Neurological:  Mental Status: She is alert and oriented to person, place, and time.      UC Treatments / Results  Labs (all labs ordered are listed, but only abnormal results are  displayed) Labs Reviewed  POCT URINALYSIS DIPSTICK, ED / UC - Abnormal; Notable for the following components:      Result Value   Hgb urine dipstick TRACE (*)    Nitrite POSITIVE (*)    Leukocytes,Ua SMALL (*)    All other components within normal limits    EKG   Radiology No results found.  Procedures Procedures (including critical care time)  Medications Ordered in UC Medications - No data to display  Initial Impression / Assessment and Plan / UC Course  I have reviewed the triage vital signs and the nursing notes.  Pertinent labs & imaging results that were available during my care of the patient were reviewed by me and considered in my medical decision making (see chart for details).     New. Treating for UTI with bactrim to prevent systemic infection. Discussed how to use along with common potential side effects and precautions.   Final Clinical Impressions(s) / UC Diagnoses   Final diagnoses:  None   Discharge Instructions   None    ED Prescriptions    None     PDMP not reviewed this encounter.   Rushie Chestnut, [place] 09/07/20 1311

## 2020-09-07 NOTE — ED Triage Notes (Signed)
Pt presents with frequent urination and burning during urination for the past few weeks.

## 2020-09-09 LAB — URINE CULTURE: Culture: >=100000 — AB
